# Patient Record
Sex: Female | Born: 1950
Health system: Southern US, Community
[De-identification: ages and names within clinical notes are randomized; demographics above are authoritative.]

## PROBLEM LIST (undated history)

## (undated) DIAGNOSIS — S2239XA Fracture of one rib, unspecified side, initial encounter for closed fracture: Secondary | ICD-10-CM

## (undated) DIAGNOSIS — B029 Zoster without complications: Secondary | ICD-10-CM

## (undated) DIAGNOSIS — L409 Psoriasis, unspecified: Secondary | ICD-10-CM

## (undated) DIAGNOSIS — D219 Benign neoplasm of connective and other soft tissue, unspecified: Secondary | ICD-10-CM

## (undated) DIAGNOSIS — A64 Unspecified sexually transmitted disease: Secondary | ICD-10-CM

## (undated) DIAGNOSIS — H269 Unspecified cataract: Secondary | ICD-10-CM

## (undated) DIAGNOSIS — C801 Malignant (primary) neoplasm, unspecified: Secondary | ICD-10-CM

## (undated) HISTORY — DX: Fracture of one rib, unspecified side, initial encounter for closed fracture: S22.39XA

## (undated) HISTORY — DX: Zoster without complications: B02.9

## (undated) HISTORY — PX: WISDOM TOOTH EXTRACTION: SHX21

## (undated) HISTORY — DX: Malignant (primary) neoplasm, unspecified: C80.1

## (undated) HISTORY — DX: Unspecified sexually transmitted disease: A64

## (undated) HISTORY — DX: Benign neoplasm of connective and other soft tissue, unspecified: D21.9

## (undated) HISTORY — DX: Psoriasis, unspecified: L40.9

## (undated) HISTORY — DX: Unspecified cataract: H26.9

## (undated) HISTORY — PX: VAGINAL HYSTERECTOMY: SUR661

---

## 2009-12-30 ENCOUNTER — Emergency Department (HOSPITAL_COMMUNITY): Admission: EM | Admit: 2009-12-30 | Discharge: 2009-12-30 | Payer: Self-pay | Admitting: Emergency Medicine

## 2010-04-23 ENCOUNTER — Emergency Department (HOSPITAL_COMMUNITY): Payer: Managed Care, Other (non HMO)

## 2010-04-23 ENCOUNTER — Emergency Department (HOSPITAL_COMMUNITY)
Admission: EM | Admit: 2010-04-23 | Discharge: 2010-04-23 | Disposition: A | Discharge status: Home / Self Care | Payer: Managed Care, Other (non HMO) | Attending: Emergency Medicine | Admitting: Emergency Medicine

## 2010-04-23 DIAGNOSIS — S1093XA Contusion of unspecified part of neck, initial encounter: Secondary | ICD-10-CM | POA: Insufficient documentation

## 2010-04-23 DIAGNOSIS — S2239XA Fracture of one rib, unspecified side, initial encounter for closed fracture: Secondary | ICD-10-CM | POA: Insufficient documentation

## 2010-04-23 DIAGNOSIS — T148XXA Other injury of unspecified body region, initial encounter: Secondary | ICD-10-CM | POA: Insufficient documentation

## 2010-04-23 DIAGNOSIS — Y92009 Unspecified place in unspecified non-institutional (private) residence as the place of occurrence of the external cause: Secondary | ICD-10-CM | POA: Insufficient documentation

## 2010-04-23 DIAGNOSIS — S0003XA Contusion of scalp, initial encounter: Secondary | ICD-10-CM | POA: Insufficient documentation

## 2010-04-23 DIAGNOSIS — IMO0002 Reserved for concepts with insufficient information to code with codable children: Secondary | ICD-10-CM | POA: Insufficient documentation

## 2010-04-23 DIAGNOSIS — W1789XA Other fall from one level to another, initial encounter: Secondary | ICD-10-CM | POA: Insufficient documentation

## 2010-11-20 ENCOUNTER — Other Ambulatory Visit: Payer: Self-pay | Admitting: Obstetrics & Gynecology

## 2010-11-20 DIAGNOSIS — N644 Mastodynia: Secondary | ICD-10-CM

## 2010-11-28 ENCOUNTER — Ambulatory Visit
Admission: RE | Admit: 2010-11-28 | Discharge: 2010-11-28 | Disposition: A | Discharge status: Home / Self Care | Payer: Managed Care, Other (non HMO) | Source: Ambulatory Visit | Attending: Obstetrics & Gynecology | Admitting: Obstetrics & Gynecology

## 2010-11-28 DIAGNOSIS — N644 Mastodynia: Secondary | ICD-10-CM

## 2011-10-21 ENCOUNTER — Other Ambulatory Visit: Payer: Self-pay | Admitting: Obstetrics and Gynecology

## 2011-10-21 ENCOUNTER — Other Ambulatory Visit: Payer: Self-pay | Admitting: Obstetrics & Gynecology

## 2011-10-21 DIAGNOSIS — Z1231 Encounter for screening mammogram for malignant neoplasm of breast: Secondary | ICD-10-CM

## 2011-12-01 ENCOUNTER — Ambulatory Visit
Admission: RE | Admit: 2011-12-01 | Discharge: 2011-12-01 | Disposition: A | Discharge status: Home / Self Care | Payer: Managed Care, Other (non HMO) | Source: Ambulatory Visit | Attending: Obstetrics and Gynecology | Admitting: Obstetrics and Gynecology

## 2011-12-01 DIAGNOSIS — Z1231 Encounter for screening mammogram for malignant neoplasm of breast: Secondary | ICD-10-CM

## 2012-11-19 ENCOUNTER — Encounter: Payer: Self-pay | Admitting: Certified Nurse Midwife

## 2012-11-22 ENCOUNTER — Other Ambulatory Visit: Payer: Self-pay

## 2012-11-22 ENCOUNTER — Encounter: Payer: Self-pay | Admitting: Certified Nurse Midwife

## 2012-11-22 ENCOUNTER — Ambulatory Visit (INDEPENDENT_AMBULATORY_CARE_PROVIDER_SITE_OTHER): Payer: Managed Care, Other (non HMO) | Admitting: Certified Nurse Midwife

## 2012-11-22 VITALS — BP 124/84 | HR 60 | Resp 16 | Ht 65.75 in | Wt 179.0 lb

## 2012-11-22 DIAGNOSIS — E559 Vitamin D deficiency, unspecified: Secondary | ICD-10-CM

## 2012-11-22 DIAGNOSIS — Z0000 Encounter for general adult medical examination without abnormal findings: Secondary | ICD-10-CM

## 2012-11-22 DIAGNOSIS — Z01419 Encounter for gynecological examination (general) (routine) without abnormal findings: Secondary | ICD-10-CM

## 2012-11-22 DIAGNOSIS — Z1231 Encounter for screening mammogram for malignant neoplasm of breast: Secondary | ICD-10-CM

## 2012-11-22 DIAGNOSIS — N952 Postmenopausal atrophic vaginitis: Secondary | ICD-10-CM

## 2012-11-22 DIAGNOSIS — Z23 Encounter for immunization: Secondary | ICD-10-CM

## 2012-11-22 LAB — POCT URINALYSIS DIPSTICK
Glucose, UA: NEGATIVE
Ketones, UA: NEGATIVE
Leukocytes, UA: NEGATIVE

## 2012-11-22 MED ORDER — ESTRADIOL 10 MCG VA TABS: 10.0000 ug | ORAL_TABLET | VAGINAL | Status: DC

## 2012-11-22 NOTE — Patient Instructions (Signed)

## 2012-11-22 NOTE — Progress Notes (Signed)
62 y.o. G90P2002 Married Caucasian Fe here for annual exam.  Menopausal no HRT. Denies vaginal bleeding. Using Vagifem for vaginal dryness with good results. Feels she has not had in last 7 years. No change in rectocele, no bowel issues. Sees PCP prn.  Patient's last menstrual period was 03/11/1995.          Sexually active: yes  The current method of family planning is status post hysterectomy.    Exercising: yes  walking Smoker:  no  Health Maintenance: Pap:  2010  MMG:  12/01/11 normal Colonoscopy:  2009 BMD:   2008? TDaP:  Unsure per patient greater than 7 years. Labs: Poct urine-neg, Hgb-13.9 Self breast exam: done occ   reports that she has never smoked. She does not have any smokeless tobacco history on file. She reports that she does not drink alcohol or use illicit drugs.  Past Medical History  Diagnosis Date  . Psoriasis   . Shingles times 3  . STD (sexually transmitted disease)     HSV1  . Fibroid   . Rib fracture     Past Surgical History  Procedure Laterality Date  . Vaginal hysterectomy      ovaries retained  . Wisdom tooth extraction      Current Outpatient Prescriptions  Medication Sig Dispense Refill  . CALCIUM PO Take by mouth daily.      . Estradiol (VAGIFEM) 10 MCG TABS vaginal tablet Place vaginally 2 (two) times a week.      Marland Kitchen GLUCOSAMINE-CHONDROITIN PO Take by mouth daily.      . Multiple Vitamins-Minerals (ICAPS) CAPS Take by mouth daily.      . Multiple Vitamins-Minerals (MULTIVITAMIN PO) Take by mouth every other day.        No current facility-administered medications for this visit.    Family History  Problem Relation Age of Onset  . Cancer Mother     uterine & colon  . Diabetes Mother   . Macular degeneration Mother   . Hepatitis Father   . Deep vein thrombosis Sister   . Diabetes Paternal Grandmother   . Stroke Maternal Grandmother   . Angina Maternal Grandfather   . Heart disease Paternal Grandfather     pacemaker    ROS:   Pertinent items are noted in HPI.  Otherwise, a comprehensive ROS was negative.  Exam:   BP 124/84  Pulse 60  Resp 16  Ht 5' 5.75" (1.67 m)  Wt 179 lb (81.194 kg)  BMI 29.11 kg/m2  LMP 03/11/1995 Height: 5' 5.75" (167 cm)  Ht Readings from Last 3 Encounters:  11/22/12 5' 5.75" (1.67 m)    General appearance: alert, cooperative and appears stated age Head: Normocephalic, without obvious abnormality, atraumatic Neck: no adenopathy, supple, symmetrical, trachea midline and thyroid normal to inspection and palpation Lungs: clear to auscultation bilaterally Breasts: normal appearance, no masses or tenderness, No nipple retraction or dimpling, No nipple discharge or bleeding, No axillary or supraclavicular adenopathy Heart: regular rate and rhythm Abdomen: soft, non-tender; no masses,  no organomegaly Extremities: extremities normal, atraumatic, no cyanosis or edema Skin: Skin color, texture, turgor normal. No rashes or lesions Lymph nodes: Cervical, supraclavicular, and axillary nodes normal. No abnormal inguinal nodes palpated Neurologic: Grossly normal   Pelvic: External genitalia:  no lesions              Urethra:  normal appearing urethra with no masses, tenderness or lesions  Bartholin's and Skene's: normal                 Vagina: normal appearing vagina with normal color and discharge, no lesions, rectocele grade 3              Cervix: absent              Pap taken: no Bimanual Exam:  Uterus:  uterus absent              Adnexa: normal adnexa and no mass, fullness, tenderness               Rectovaginal: Confirms               Anus:  normal sphincter tone, no lesions  A:  Well Woman with normal exam  Menopausal no HRT, S/P TVH due to bleeding  History of vitamin D deficiency  Immunization update   Atrophic vaginitis Vagifem working well  P:   Reviewed health and wellness pertinent to exam  Lab: Vitamin D  Reviewed risks and benefits, patient requests  TDAP  Rx Vagifem see order  Pap smear as per guidelines   Mammogram yearly pap smear not taken today counseled on breast self exam, mammography screening, adequate intake of calcium and vitamin D, diet and exercise, Kegel's exercises. Discussed risks and benefits of colonoscopy, patient to schedule. If patient has problems with scheduling will advise.  return annually or prn  An After Visit Summary was printed and given to the patient.

## 2012-11-23 LAB — VITAMIN D 25 HYDROXY (VIT D DEFICIENCY, FRACTURES): Vit D, 25-Hydroxy: 31 ng/mL (ref 30–89)

## 2012-11-23 LAB — HEMOGLOBIN, FINGERSTICK: Hemoglobin, fingerstick: 13.9 g/dL (ref 12.0–16.0)

## 2012-11-23 NOTE — Progress Notes (Signed)
Note reviewed, agree with plan.  Kaja Jackowski, MD  

## 2012-12-10 ENCOUNTER — Ambulatory Visit
Admission: RE | Admit: 2012-12-10 | Discharge: 2012-12-10 | Disposition: A | Discharge status: Home / Self Care | Payer: Managed Care, Other (non HMO) | Source: Ambulatory Visit

## 2012-12-10 DIAGNOSIS — Z1231 Encounter for screening mammogram for malignant neoplasm of breast: Secondary | ICD-10-CM

## 2013-11-17 ENCOUNTER — Other Ambulatory Visit: Payer: Self-pay

## 2013-11-17 DIAGNOSIS — N952 Postmenopausal atrophic vaginitis: Secondary | ICD-10-CM

## 2013-11-17 NOTE — Telephone Encounter (Signed)
Last AEX; 11/22/12 Last refill:11/22/12 #24 X 3 Current AEX:11/23/13  Please advise

## 2013-11-23 ENCOUNTER — Ambulatory Visit (INDEPENDENT_AMBULATORY_CARE_PROVIDER_SITE_OTHER): Payer: Managed Care, Other (non HMO) | Admitting: Certified Nurse Midwife

## 2013-11-23 ENCOUNTER — Other Ambulatory Visit: Payer: Self-pay

## 2013-11-23 ENCOUNTER — Encounter: Payer: Self-pay | Admitting: Certified Nurse Midwife

## 2013-11-23 VITALS — BP 110/78 | HR 68 | Resp 16 | Ht 65.75 in | Wt 168.0 lb

## 2013-11-23 DIAGNOSIS — T63444D Toxic effect of venom of bees, undetermined, subsequent encounter: Secondary | ICD-10-CM

## 2013-11-23 DIAGNOSIS — Z01419 Encounter for gynecological examination (general) (routine) without abnormal findings: Secondary | ICD-10-CM

## 2013-11-23 DIAGNOSIS — Z0000 Encounter for general adult medical examination without abnormal findings: Secondary | ICD-10-CM

## 2013-11-23 DIAGNOSIS — Z1231 Encounter for screening mammogram for malignant neoplasm of breast: Secondary | ICD-10-CM

## 2013-11-23 DIAGNOSIS — Z5189 Encounter for other specified aftercare: Secondary | ICD-10-CM

## 2013-11-23 DIAGNOSIS — N952 Postmenopausal atrophic vaginitis: Secondary | ICD-10-CM

## 2013-11-23 LAB — POCT URINALYSIS DIPSTICK
BILIRUBIN UA: NEGATIVE
Blood, UA: NEGATIVE
Glucose, UA: NEGATIVE
KETONES UA: NEGATIVE
LEUKOCYTES UA: NEGATIVE
Nitrite, UA: NEGATIVE
PH UA: 5
Protein, UA: NEGATIVE
Urobilinogen, UA: NEGATIVE

## 2013-11-23 LAB — HEMOGLOBIN, FINGERSTICK: Hemoglobin, fingerstick: 13.7 g/dL (ref 12.0–16.0)

## 2013-11-23 MED ORDER — EPINEPHRINE 0.3 MG/0.3ML IJ SOAJ: 0.3000 mg | Freq: Once | INTRAMUSCULAR | Status: DC

## 2013-11-23 MED ORDER — ESTRADIOL 10 MCG VA TABS: ORAL_TABLET | VAGINAL | Status: DC

## 2013-11-23 MED ORDER — ESTRADIOL 10 MCG VA TABS: 10.0000 ug | ORAL_TABLET | VAGINAL | Status: DC

## 2013-11-23 NOTE — Patient Instructions (Signed)

## 2013-11-23 NOTE — Progress Notes (Signed)
63 y.o. G75P2002 Married Caucasian Fe here for annual exam.  Menopausal no HRT. Denies vaginal bleeding. Using Vagifem with good results, for dryness.  Planning trip to [place] soon and needs one Rx for Vagifem because mail order will not be in. Also Epi pen expired Bee allergy and needs refill. Sees PCP for aex and labs. No health issues today.  Patient's last menstrual period was 03/11/1995.          Sexually active: Yes.    The current method of family planning is status post hysterectomy.    Exercising: Yes.    walking Smoker:  no  Health Maintenance: Pap:  2010 neg per patient MMG:  12-10-12 density category b, birads category 1:neg Colonoscopy:  06-03-13 neg per patient 5 years BMD:   2007 TDaP: 2014 Labs: Poct urine-neg, Hgb-13.7 Self breast exam: done occ   reports that she has never smoked. She does not have any smokeless tobacco history on file. She reports that she does not drink alcohol or use illicit drugs.  Past Medical History  Diagnosis Date  . Psoriasis   . Shingles times 3  . STD (sexually transmitted disease)     HSV1  . Fibroid   . Rib fracture     Past Surgical History  Procedure Laterality Date  . Vaginal hysterectomy      ovaries retained  . Wisdom tooth extraction      Current Outpatient Prescriptions  Medication Sig Dispense Refill  . CALCIUM PO Take by mouth daily.      . Estradiol (VAGIFEM) 10 MCG TABS vaginal tablet Place 1 tablet (10 mcg total) vaginally 2 (two) times a week.  24 tablet  3  . GLUCOSAMINE-CHONDROITIN PO Take by mouth daily.      . Multiple Vitamins-Minerals (ICAPS) CAPS Take by mouth daily.      . Multiple Vitamins-Minerals (MULTIVITAMIN PO) Take by mouth every other day.       Marland Kitchen EPINEPHrine 0.3 mg/0.3 mL IJ SOAJ injection Inject 0.3 mLs (0.3 mg total) into the muscle once.  1 Device  1  . Estradiol (VAGIFEM) 10 MCG TABS vaginal tablet Use as directed  8 tablet  0   No current facility-administered medications for this visit.     Family History  Problem Relation Age of Onset  . Cancer Mother     uterine & colon  . Diabetes Mother   . Macular degeneration Mother   . Hepatitis Father   . Deep vein thrombosis Sister   . Diabetes Paternal Grandmother   . Stroke Maternal Grandmother   . Angina Maternal Grandfather   . Heart disease Paternal Grandfather     pacemaker    ROS:  Pertinent items are noted in HPI.  Otherwise, a comprehensive ROS was negative.  Exam:   BP 110/78  Pulse 68  Resp 16  Ht 5' 5.75" (1.67 m)  Wt 168 lb (76.204 kg)  BMI 27.32 kg/m2  LMP 03/11/1995 Height: 5' 5.75" (167 cm)  Ht Readings from Last 3 Encounters:  11/23/13 5' 5.75" (1.67 m)  11/22/12 5' 5.75" (1.67 m)    General appearance: alert, cooperative and appears stated age Head: Normocephalic, without obvious abnormality, atraumatic Neck: no adenopathy, supple, symmetrical, trachea midline and thyroid normal to inspection and palpation Lungs: clear to auscultation bilaterally Breasts: normal appearance, no masses or tenderness, No nipple retraction or dimpling, No nipple discharge or bleeding, No axillary or supraclavicular adenopathy Heart: regular rate and rhythm Abdomen: soft, non-tender; no masses,  no organomegaly Extremities: extremities normal, atraumatic, no cyanosis or edema Skin: Skin color, texture, turgor normal. No rashes or lesions Lymph nodes: Cervical, supraclavicular, and axillary nodes normal. No abnormal inguinal nodes palpated Neurologic: Grossly normal   Pelvic: External genitalia:  no lesions              Urethra:  normal appearing urethra with no masses, tenderness or lesions              Bartholin's and Skene's: normal                 Vagina: normal appearing vagina with normal color and discharge, no lesions              Cervix: absent              Pap taken: No. Bimanual Exam:  Uterus:  uterus absent              Adnexa: no mass, fullness, tenderness and adnexal absent                Rectovaginal: Confirms               Anus:  normal sphincter tone, no lesions  A:  Well Woman with normal exam  Menopausal no HRT  Atrophic vaginitis Using Vagifem with good results  Shingles candidate  Bee allergy  P:   Reviewed health and wellness pertinent to exam  Rx Vagifem see order to local pharmacy one month  Refills to mail order  Rx Epi pen see order  Pap smear not taken today   counseled on breast self exam, mammography screening, adequate intake of calcium and vitamin D, diet and exercise  return annually or prn  An After Visit Summary was printed and given to the patient.

## 2013-11-28 NOTE — Progress Notes (Signed)
Reviewed personally.  M. Suzanne Truth Barot, MD.  

## 2013-12-12 ENCOUNTER — Telehealth: Payer: Self-pay | Admitting: Certified Nurse Midwife

## 2013-12-12 DIAGNOSIS — N952 Postmenopausal atrophic vaginitis: Secondary | ICD-10-CM

## 2013-12-12 MED ORDER — ESTRADIOL 10 MCG VA TABS: 10.0000 ug | ORAL_TABLET | VAGINAL | Status: DC

## 2013-12-12 NOTE — Telephone Encounter (Signed)
Estradiol (VAGIFEM) 10 MCG TABS vaginal tablet Patient needs refills of Vagifem called to [facility]

## 2013-12-12 NOTE — Telephone Encounter (Signed)
Vagifem -Last refilled: 11/23/13 #24/3 refills was sent to [facility] -Phamacy: 11/23/13  -AEX Scheduled: 11/29/14 with Ms. Debbie  -Vagifem #24/3 refills sent to [facility]  -Patient is aware.  Routed to provider for review, encounter closed.

## 2013-12-15 ENCOUNTER — Ambulatory Visit
Admission: RE | Admit: 2013-12-15 | Discharge: 2013-12-15 | Disposition: A | Discharge status: Home / Self Care | Payer: Managed Care, Other (non HMO) | Source: Ambulatory Visit

## 2013-12-15 DIAGNOSIS — Z1231 Encounter for screening mammogram for malignant neoplasm of breast: Secondary | ICD-10-CM

## 2013-12-19 ENCOUNTER — Telehealth: Payer: Self-pay | Admitting: Certified Nurse Midwife

## 2013-12-19 NOTE — Telephone Encounter (Signed)
Pt is calling to Follow up from a conversation with debbi about getting rx for shingles vaccine.

## 2013-12-20 NOTE — Telephone Encounter (Signed)
Patient has decided she would like shingles vaccine.  Would like rx.  Advised would call her back when we have it at front desk for her to pick up.  Patient requests detailed message to home number when ready.

## 2013-12-21 NOTE — Telephone Encounter (Signed)
Spoke with patient. Advised rx for shingles vaccine is up at the front desk ready to pick up. Patient is agreeable and will come by to pick up today.  Routing to provider for final review. Patient agreeable to disposition. Will close encounter

## 2014-01-09 ENCOUNTER — Encounter: Payer: Self-pay | Admitting: Certified Nurse Midwife

## 2014-11-07 ENCOUNTER — Other Ambulatory Visit: Payer: Self-pay | Admitting: Certified Nurse Midwife

## 2014-11-07 NOTE — Telephone Encounter (Signed)
Medication refill request: Vagifem  10 mcg  Last AEX:  11/23/13 with DL Next AEX: 11/29/14 with DL  Last MMG (if hormonal medication request): 12/15/2013 breast density category bi-rads 1: neg Refill authorized: #[id number] rfs

## 2014-11-29 ENCOUNTER — Other Ambulatory Visit: Payer: Self-pay

## 2014-11-29 ENCOUNTER — Ambulatory Visit (INDEPENDENT_AMBULATORY_CARE_PROVIDER_SITE_OTHER): Payer: Managed Care, Other (non HMO) | Admitting: Certified Nurse Midwife

## 2014-11-29 ENCOUNTER — Encounter: Payer: Self-pay | Admitting: Certified Nurse Midwife

## 2014-11-29 VITALS — BP 122/80 | HR 72 | Resp 16 | Ht 65.75 in | Wt 182.0 lb

## 2014-11-29 DIAGNOSIS — Z01419 Encounter for gynecological examination (general) (routine) without abnormal findings: Secondary | ICD-10-CM

## 2014-11-29 DIAGNOSIS — Z0000 Encounter for general adult medical examination without abnormal findings: Secondary | ICD-10-CM | POA: Diagnosis not present

## 2014-11-29 DIAGNOSIS — N8110 Cystocele, unspecified: Secondary | ICD-10-CM | POA: Diagnosis not present

## 2014-11-29 DIAGNOSIS — N816 Rectocele: Secondary | ICD-10-CM

## 2014-11-29 DIAGNOSIS — N952 Postmenopausal atrophic vaginitis: Secondary | ICD-10-CM

## 2014-11-29 DIAGNOSIS — Z1231 Encounter for screening mammogram for malignant neoplasm of breast: Secondary | ICD-10-CM

## 2014-11-29 LAB — POCT URINALYSIS DIPSTICK
Bilirubin, UA: NEGATIVE
Blood, UA: NEGATIVE
Glucose, UA: NEGATIVE
KETONES UA: NEGATIVE
LEUKOCYTES UA: NEGATIVE
NITRITE UA: NEGATIVE
PH UA: 5
PROTEIN UA: NEGATIVE
Urobilinogen, UA: NEGATIVE

## 2014-11-29 MED ORDER — ESTRADIOL 10 MCG VA TABS: ORAL_TABLET | VAGINAL | Status: DC

## 2014-11-29 NOTE — Progress Notes (Signed)
Reviewed personally.  M. Suzanne Miller, MD.  

## 2014-11-29 NOTE — Patient Instructions (Signed)

## 2014-11-29 NOTE — Progress Notes (Signed)
64 y.o. G48P2002 Married  Caucasian Fe here for annual exam.  Menopausal no HRT. Denies vaginal bleeding. Using Vagifem for atrophic vaginitis, working well. Needs refill. Patient would see urgent care if needed. New grandchildren 3 new granddaughters! Needs screening labs, but not sure she will do today. No health issues today.  Patient's last menstrual period was 03/11/1995.          Sexually active: Yes.    The current method of family planning is status post hysterectomy.    Exercising: Yes.    walking Smoker:  no  Health Maintenance: Pap: 2010 neg per patient MMG: 12-15-13 category b density,birads 1:neg Colonoscopy:  2015 neg f/u 11yrs per patient family history BMD:   2007 TDaP:  2014 Labs: poct urine-neg Self breast exam: done occ   reports that she has never smoked. She does not have any smokeless tobacco history on file. She reports that she does not drink alcohol or use illicit drugs.  Past Medical History  Diagnosis Date  . Psoriasis   . Shingles times 3  . STD (sexually transmitted disease)     HSV1  . Fibroid   . Rib fracture     Past Surgical History  Procedure Laterality Date  . Vaginal hysterectomy      ovaries retained  . Wisdom tooth extraction      Current Outpatient Prescriptions  Medication Sig Dispense Refill  . CALCIUM PO Take by mouth daily.    . Multiple Vitamins-Minerals (ICAPS) CAPS Take by mouth daily.    . Multiple Vitamins-Minerals (MULTIVITAMIN PO) Take by mouth every other day.     Marland Kitchen VAGIFEM 10 MCG TABS vaginal tablet INSERT 1 TABLET VAGINALLY 2TIMES A WEEK 24 tablet 0  . EPINEPHrine 0.3 mg/0.3 mL IJ SOAJ injection Inject 0.3 mLs (0.3 mg total) into the muscle once. (Patient not taking: Reported on 11/29/2014) 1 Device 1   No current facility-administered medications for this visit.    Family History  Problem Relation Age of Onset  . Cancer Mother     uterine & colon  . Diabetes Mother   . Macular degeneration Mother   . Hepatitis  Father   . Deep vein thrombosis Sister   . Diabetes Paternal Grandmother   . Stroke Maternal Grandmother   . Angina Maternal Grandfather   . Heart disease Paternal Grandfather     pacemaker    ROS:  Pertinent items are noted in HPI.  Otherwise, a comprehensive ROS was negative.  Exam:   BP 122/80 mmHg  Pulse 72  Resp 16  Ht 5' 5.75" (1.67 m)  Wt 182 lb (82.555 kg)  BMI 29.60 kg/m2  LMP 03/11/1995 Height: 5' 5.75" (167 cm) Ht Readings from Last 3 Encounters:  11/29/14 5' 5.75" (1.67 m)  11/23/13 5' 5.75" (1.67 m)  11/22/12 5' 5.75" (1.67 m)    General appearance: alert, cooperative and appears stated age Head: Normocephalic, without obvious abnormality, atraumatic Neck: no adenopathy, supple, symmetrical, trachea midline and thyroid normal to inspection and palpation Lungs: clear to auscultation bilaterally Breasts: normal appearance, no masses or tenderness, No nipple retraction or dimpling, No nipple discharge or bleeding, No axillary or supraclavicular adenopathy Heart: regular rate and rhythm Abdomen: soft, non-tender; no masses,  no organomegaly Extremities: extremities normal, atraumatic, no cyanosis or edema Skin: Skin color, texture, turgor normal. No rashes or lesions Lymph nodes: Cervical, supraclavicular, and axillary nodes normal. No abnormal inguinal nodes palpated Neurologic: Grossly normal   Pelvic: External genitalia:  no lesions  Urethra:  normal appearing urethra with no masses, tenderness or lesions              Bartholin's and Skene's: normal                 Vagina: normal appearing vagina with normal color and discharge, no lesions, cystocele and rectocele mild noted              Cervix: absent              Pap taken: No. Bimanual Exam:  Uterus:  uterus absent              Adnexa: normal adnexa and no mass, fullness, tenderness               Rectovaginal: Confirms               Anus:  normal sphincter tone, no lesions  Chaperone  present: yes  A:  Well Woman with normal exam  Menopausal no HRT S/P TAH for fibroids, ovaries retained  Atrophic vaginitis with Vagifem use  Schedule fasting labs  BMD/mammogram due   Mild cystocele and rectocele noted  P:   Reviewed health and wellness pertinent to exam  Rx Vagifem see order  Labs: CMP,Lipid panel, TSH, CBC, Vitamin D  Patient will schedule mammogram and BMD  Discussed findings and etiology. Discussed working on kegel exercises to improve support. Questions answered. Will advise if changes.  Pap smear as above not taken   counseled on breast self exam, mammography screening, adequate intake of calcium and vitamin D, diet and exercise, Kegel's exercises  return annually or prn  An After Visit Summary was printed and given to the patient.

## 2014-12-04 ENCOUNTER — Other Ambulatory Visit (INDEPENDENT_AMBULATORY_CARE_PROVIDER_SITE_OTHER): Payer: Managed Care, Other (non HMO)

## 2014-12-04 DIAGNOSIS — Z780 Asymptomatic menopausal state: Secondary | ICD-10-CM

## 2014-12-04 DIAGNOSIS — Z0000 Encounter for general adult medical examination without abnormal findings: Secondary | ICD-10-CM

## 2014-12-04 LAB — LIPID PANEL
CHOLESTEROL: 189 mg/dL (ref 125–200)
HDL: 47 mg/dL (ref >=46)
LDL Cholesterol: 111 mg/dL (ref <130)
TRIGLYCERIDES: 153 mg/dL — AB (ref <150)
Total CHOL/HDL Ratio: 4 Ratio (ref <=5.0)
VLDL: 31 mg/dL — AB (ref <30)

## 2014-12-04 LAB — COMPREHENSIVE METABOLIC PANEL
ALBUMIN: 4.1 g/dL (ref 3.6–5.1)
ALK PHOS: 47 U/L (ref 33–130)
ALT: 9 U/L (ref 6–29)
AST: 14 U/L (ref 10–35)
BUN: 11 mg/dL (ref 7–25)
CHLORIDE: 105 mmol/L (ref 98–110)
CO2: 30 mmol/L (ref 20–31)
CREATININE: 0.79 mg/dL (ref 0.50–0.99)
Calcium: 9.1 mg/dL (ref 8.6–10.4)
Glucose, Bld: 86 mg/dL (ref 65–99)
Potassium: 4 mmol/L (ref 3.5–5.3)
SODIUM: 139 mmol/L (ref 135–146)
TOTAL PROTEIN: 6.4 g/dL (ref 6.1–8.1)
Total Bilirubin: 0.7 mg/dL (ref 0.2–1.2)

## 2014-12-04 LAB — CBC
HEMATOCRIT: 41.4 % (ref 36.0–46.0)
HEMOGLOBIN: 14.3 g/dL (ref 12.0–15.0)
MCH: 31.7 pg (ref 26.0–34.0)
MCHC: 34.5 g/dL (ref 30.0–36.0)
MCV: 91.8 fL (ref 78.0–100.0)
MPV: 10.1 fL (ref 8.6–12.4)
Platelets: 253 10*3/uL (ref 150–400)
RBC: 4.51 MIL/uL (ref 3.87–5.11)
RDW: 13.3 % (ref 11.5–15.5)
WBC: 3.6 10*3/uL — AB (ref 4.0–10.5)

## 2014-12-04 LAB — VITAMIN D 25 HYDROXY (VIT D DEFICIENCY, FRACTURES): Vit D, 25-Hydroxy: 37 ng/mL (ref 30–100)

## 2014-12-04 LAB — TSH: TSH: 2.421 u[IU]/mL (ref 0.350–4.500)

## 2014-12-06 ENCOUNTER — Telehealth: Payer: Self-pay

## 2014-12-06 ENCOUNTER — Other Ambulatory Visit: Payer: Self-pay | Admitting: Certified Nurse Midwife

## 2014-12-06 DIAGNOSIS — R899 Unspecified abnormal finding in specimens from other organs, systems and tissues: Secondary | ICD-10-CM

## 2014-12-06 NOTE — Telephone Encounter (Signed)
-----   Message from Regina Eck, CNM sent at 12/06/2014 12:20 PM EDT ----- Notify patient that Vitamin D is 37 which is low normal. Would recommend  OTC 800 IU Vitamin D 3 daily to maintain TSH and liver, kidney and glucose profile are normal Lipid panel shows slightly elevated triglycerides so watch the fried and sweet foods in diet Over all profile looks good CBC normal, but WBC slightly low needs recheck on one month order in please schedule

## 2014-12-06 NOTE — Telephone Encounter (Signed)
lmtcb

## 2014-12-06 NOTE — Telephone Encounter (Signed)
Pt notified by reina,cma

## 2015-01-04 ENCOUNTER — Other Ambulatory Visit (INDEPENDENT_AMBULATORY_CARE_PROVIDER_SITE_OTHER): Payer: Managed Care, Other (non HMO)

## 2015-01-04 DIAGNOSIS — R899 Unspecified abnormal finding in specimens from other organs, systems and tissues: Secondary | ICD-10-CM

## 2015-01-04 LAB — CBC WITH DIFFERENTIAL/PLATELET
BASOS PCT: 0 % (ref 0–1)
Basophils Absolute: 0 10*3/uL (ref 0.0–0.1)
EOS ABS: 0 10*3/uL (ref 0.0–0.7)
Eosinophils Relative: 1 % (ref 0–5)
HCT: 41.2 % (ref 36.0–46.0)
Hemoglobin: 13.7 g/dL (ref 12.0–15.0)
LYMPHS ABS: 1.1 10*3/uL (ref 0.7–4.0)
Lymphocytes Relative: 26 % (ref 12–46)
MCH: 31 pg (ref 26.0–34.0)
MCHC: 33.3 g/dL (ref 30.0–36.0)
MCV: 93.2 fL (ref 78.0–100.0)
MONO ABS: 0.3 10*3/uL (ref 0.1–1.0)
MONOS PCT: 8 % (ref 3–12)
MPV: 10.4 fL (ref 8.6–12.4)
Neutro Abs: 2.8 10*3/uL (ref 1.7–7.7)
Neutrophils Relative %: 65 % (ref 43–77)
PLATELETS: 277 10*3/uL (ref 150–400)
RBC: 4.42 MIL/uL (ref 3.87–5.11)
RDW: 13.2 % (ref 11.5–15.5)
WBC: 4.3 10*3/uL (ref 4.0–10.5)

## 2015-01-12 ENCOUNTER — Other Ambulatory Visit: Payer: Managed Care, Other (non HMO)

## 2015-01-12 ENCOUNTER — Ambulatory Visit: Payer: Managed Care, Other (non HMO)

## 2015-02-07 ENCOUNTER — Ambulatory Visit
Admission: RE | Admit: 2015-02-07 | Discharge: 2015-02-07 | Disposition: A | Discharge status: Home / Self Care | Payer: Managed Care, Other (non HMO) | Source: Ambulatory Visit | Attending: Certified Nurse Midwife | Admitting: Certified Nurse Midwife

## 2015-02-07 ENCOUNTER — Ambulatory Visit
Admission: RE | Admit: 2015-02-07 | Discharge: 2015-02-07 | Disposition: A | Discharge status: Home / Self Care | Payer: Managed Care, Other (non HMO) | Source: Ambulatory Visit

## 2015-02-07 DIAGNOSIS — Z1231 Encounter for screening mammogram for malignant neoplasm of breast: Secondary | ICD-10-CM

## 2015-02-07 DIAGNOSIS — Z780 Asymptomatic menopausal state: Secondary | ICD-10-CM

## 2015-11-30 ENCOUNTER — Ambulatory Visit: Payer: Managed Care, Other (non HMO) | Admitting: Certified Nurse Midwife

## 2015-12-04 ENCOUNTER — Ambulatory Visit (INDEPENDENT_AMBULATORY_CARE_PROVIDER_SITE_OTHER): Payer: Managed Care, Other (non HMO) | Admitting: Certified Nurse Midwife

## 2015-12-04 ENCOUNTER — Encounter: Payer: Self-pay | Admitting: Certified Nurse Midwife

## 2015-12-04 VITALS — BP 118/78 | HR 64 | Resp 16 | Ht 66.0 in | Wt 167.0 lb

## 2015-12-04 DIAGNOSIS — N8110 Cystocele, unspecified: Secondary | ICD-10-CM

## 2015-12-04 DIAGNOSIS — Z01419 Encounter for gynecological examination (general) (routine) without abnormal findings: Secondary | ICD-10-CM | POA: Diagnosis not present

## 2015-12-04 DIAGNOSIS — N952 Postmenopausal atrophic vaginitis: Secondary | ICD-10-CM

## 2015-12-04 DIAGNOSIS — Z0000 Encounter for general adult medical examination without abnormal findings: Secondary | ICD-10-CM

## 2015-12-04 DIAGNOSIS — N816 Rectocele: Secondary | ICD-10-CM

## 2015-12-04 LAB — CBC WITH DIFFERENTIAL/PLATELET
BASOS ABS: 0 {cells}/uL (ref 0–200)
Basophils Relative: 0 %
EOS PCT: 1 %
Eosinophils Absolute: 42 cells/uL (ref 15–500)
HCT: 41.9 % (ref 35.0–45.0)
HEMOGLOBIN: 14 g/dL (ref 11.7–15.5)
LYMPHS ABS: 1302 {cells}/uL (ref 850–3900)
LYMPHS PCT: 31 %
MCH: 31.3 pg (ref 27.0–33.0)
MCHC: 33.4 g/dL (ref 32.0–36.0)
MCV: 93.5 fL (ref 80.0–100.0)
MPV: 10.3 fL (ref 7.5–12.5)
Monocytes Absolute: 378 cells/uL (ref 200–950)
Monocytes Relative: 9 %
NEUTROS PCT: 59 %
Neutro Abs: 2478 cells/uL (ref 1500–7800)
Platelets: 249 10*3/uL (ref 140–400)
RBC: 4.48 MIL/uL (ref 3.80–5.10)
RDW: 12.9 % (ref 11.0–15.0)
WBC: 4.2 10*3/uL (ref 3.8–10.8)

## 2015-12-04 LAB — LIPID PANEL
CHOL/HDL RATIO: 3.7 ratio (ref <=5.0)
CHOLESTEROL: 196 mg/dL (ref 125–200)
HDL: 53 mg/dL (ref >=46)
LDL Cholesterol: 123 mg/dL (ref <130)
Triglycerides: 98 mg/dL (ref <150)
VLDL: 20 mg/dL (ref <30)

## 2015-12-04 LAB — POCT URINALYSIS DIPSTICK
BILIRUBIN UA: NEGATIVE
Blood, UA: NEGATIVE
Glucose, UA: NEGATIVE
KETONES UA: NEGATIVE
LEUKOCYTES UA: NEGATIVE
Nitrite, UA: NEGATIVE
PH UA: 5
Protein, UA: NEGATIVE
Urobilinogen, UA: NEGATIVE

## 2015-12-04 MED ORDER — ESTRADIOL 10 MCG VA TABS: ORAL_TABLET | VAGINAL | 4 refills | Status: DC

## 2015-12-04 NOTE — Patient Instructions (Signed)

## 2015-12-04 NOTE — Progress Notes (Signed)
65 y.o. G13P2002 Married  Caucasian Fe here for annual exam. Menopausal no vaginal bleeding or vaginal dryness. Using Vagifem with good results, desires continuance. Planning on establishing with PCP in the next year, request recommendations. No health issues today. Screening labs desired. Spent summer with all 3 granddaughters in 3 different states!  Patient's last menstrual period was 03/11/1995.          Sexually active: Yes.    The current method of family planning is status post hysterectomy.    Exercising: Yes.    walking Smoker:  no  Health Maintenance: Pap:  2010 neg per patient MMG:  02-07-15 category b density birads 1:neg Colonoscopy:  2015 neg f/u 43yrs due to family hx BMD:   2016 TDaP:  2014 Shingles: 2015 Pneumonia: not done Hep C and HIV: not done Labs: poct urine-neg Self breast exam: done occ Flu shot 11-23-15   reports that she has never smoked. She has never used smokeless tobacco. She reports that she does not drink alcohol or use drugs.  Past Medical History:  Diagnosis Date  . Fibroid   . Psoriasis   . Rib fracture   . Shingles times 3  . STD (sexually transmitted disease)    HSV1    Past Surgical History:  Procedure Laterality Date  . VAGINAL HYSTERECTOMY     ovaries retained  . WISDOM TOOTH EXTRACTION      Current Outpatient Prescriptions  Medication Sig Dispense Refill  . CALCIUM PO Take by mouth daily.    . Cholecalciferol (VITAMIN D PO) Take by mouth daily.    . Estradiol (VAGIFEM) 10 MCG TABS vaginal tablet INSERT 1 TABLET VAGINALLY 2TIMES A WEEK 24 tablet 4  . Multiple Vitamins-Minerals (ICAPS) CAPS Take by mouth daily.    Marland Kitchen EPINEPHrine 0.3 mg/0.3 mL IJ SOAJ injection Inject 0.3 mLs (0.3 mg total) into the muscle once. (Patient not taking: Reported on 12/04/2015) 1 Device 1   No current facility-administered medications for this visit.     Family History  Problem Relation Age of Onset  . Cancer Mother     uterine & colon  . Diabetes  Mother   . Macular degeneration Mother   . Hepatitis Father   . Deep vein thrombosis Sister   . Diabetes Paternal Grandmother   . Stroke Maternal Grandmother   . Angina Maternal Grandfather   . Heart disease Paternal Grandfather     pacemaker    ROS:  Pertinent items are noted in HPI.  Otherwise, a comprehensive ROS was negative.  Exam:   BP 118/78   Pulse 64   Resp 16   Ht 5\' 6"  (1.676 m)   Wt 167 lb (75.8 kg)   LMP 03/11/1995   BMI 26.95 kg/m  Height: 5\' 6"  (167.6 cm) Ht Readings from Last 3 Encounters:  12/04/15 5\' 6"  (1.676 m)  11/29/14 5' 5.75" (1.67 m)  11/23/13 5' 5.75" (1.67 m)    General appearance: alert, cooperative and appears stated age Head: Normocephalic, without obvious abnormality, atraumatic Neck: no adenopathy, supple, symmetrical, trachea midline and thyroid normal to inspection and palpation Lungs: clear to auscultation bilaterally Breasts: normal appearance, no masses or tenderness, No nipple retraction or dimpling, No nipple discharge or bleeding, No axillary or supraclavicular adenopathy Heart: regular rate and rhythm Abdomen: soft, non-tender; no masses,  no organomegaly Extremities: extremities normal, atraumatic, no cyanosis or edema Skin: Skin color, texture, turgor normal. No rashes or lesions Lymph nodes: Cervical, supraclavicular, and axillary nodes normal. No  abnormal inguinal nodes palpated Neurologic: Grossly normal   Pelvic: External genitalia:  no lesions              Urethra:  normal appearing urethra with no masses, tenderness or lesions              Bartholin's and Skene's: normal                 Vagina: normal appearing vagina with normal color and discharge, no lesions              Cervix: absent              Pap taken: No. Bimanual Exam:  Uterus:  uterus absent              Adnexa: normal adnexa and no mass, fullness, tenderness               Rectovaginal: Confirms               Anus:  normal sphincter tone, no  lesions  Chaperone present: yes  A:  Well Woman with normal exam  Menopausal  No HRT  Atrophic vaginitis with Vagifem use with good results, desires continuance  Cystocele/rectocele mild no change, not symptomatic  Screening labs  P:   Reviewed health and wellness pertinent to exam  Discussed risks and benefits of Vagifem use, questions addressed.  Continue with kegel exercises and avoid holding urine for long periods of time. Will advise if change.  Rx Vagifem see order with instructions  Labs:Vitamin D, Lipid panel, Hep C. Hgb A1-C, CBC with diff   Pap smear as above not taken   counseled on breast self exam, mammography screening, adequate intake of calcium and vitamin D, diet and exercise, Kegel's exercises  return annually or prn  An After Visit Summary was printed and given to the patient.

## 2015-12-05 LAB — HEMOGLOBIN A1C
HEMOGLOBIN A1C: 5.4 % (ref <5.7)
MEAN PLASMA GLUCOSE: 108 mg/dL

## 2015-12-05 LAB — HEPATITIS C ANTIBODY: HCV AB: NEGATIVE

## 2015-12-05 LAB — VITAMIN D 25 HYDROXY (VIT D DEFICIENCY, FRACTURES): VIT D 25 HYDROXY: 45 ng/mL (ref 30–100)

## 2015-12-07 NOTE — Progress Notes (Signed)
Encounter reviewed Kelsey Lumadue, MD   

## 2016-01-04 ENCOUNTER — Other Ambulatory Visit: Payer: Self-pay | Admitting: Certified Nurse Midwife

## 2016-01-04 DIAGNOSIS — N8110 Cystocele, unspecified: Secondary | ICD-10-CM

## 2016-01-04 DIAGNOSIS — N952 Postmenopausal atrophic vaginitis: Secondary | ICD-10-CM

## 2016-01-04 DIAGNOSIS — N816 Rectocele: Secondary | ICD-10-CM

## 2016-01-07 ENCOUNTER — Other Ambulatory Visit: Payer: Self-pay | Admitting: Certified Nurse Midwife

## 2016-01-07 DIAGNOSIS — Z1231 Encounter for screening mammogram for malignant neoplasm of breast: Secondary | ICD-10-CM

## 2016-02-08 ENCOUNTER — Ambulatory Visit
Admission: RE | Admit: 2016-02-08 | Discharge: 2016-02-08 | Disposition: A | Discharge status: Home / Self Care | Payer: 59 | Source: Ambulatory Visit | Attending: Certified Nurse Midwife | Admitting: Certified Nurse Midwife

## 2016-02-08 DIAGNOSIS — Z1231 Encounter for screening mammogram for malignant neoplasm of breast: Secondary | ICD-10-CM

## 2016-07-02 ENCOUNTER — Ambulatory Visit: Payer: 59 | Admitting: Gynecologic Oncology

## 2016-12-05 ENCOUNTER — Ambulatory Visit: Payer: Managed Care, Other (non HMO) | Admitting: Certified Nurse Midwife

## 2017-01-01 ENCOUNTER — Other Ambulatory Visit: Payer: Self-pay | Admitting: Certified Nurse Midwife

## 2017-01-01 DIAGNOSIS — Z1231 Encounter for screening mammogram for malignant neoplasm of breast: Secondary | ICD-10-CM

## 2017-01-06 ENCOUNTER — Ambulatory Visit (INDEPENDENT_AMBULATORY_CARE_PROVIDER_SITE_OTHER): Payer: 59 | Admitting: Certified Nurse Midwife

## 2017-01-06 ENCOUNTER — Encounter: Payer: Self-pay | Admitting: Certified Nurse Midwife

## 2017-01-06 VITALS — BP 118/76 | HR 68 | Resp 16 | Ht 66.25 in | Wt 183.0 lb

## 2017-01-06 DIAGNOSIS — Z01419 Encounter for gynecological examination (general) (routine) without abnormal findings: Secondary | ICD-10-CM | POA: Diagnosis not present

## 2017-01-06 DIAGNOSIS — Z659 Problem related to unspecified psychosocial circumstances: Secondary | ICD-10-CM

## 2017-01-06 DIAGNOSIS — N952 Postmenopausal atrophic vaginitis: Secondary | ICD-10-CM

## 2017-01-06 MED ORDER — ESTRADIOL 10 MCG VA TABS: ORAL_TABLET | VAGINAL | 4 refills | Status: DC

## 2017-01-06 NOTE — Progress Notes (Signed)
66 y.o. G70P2002 Married  Caucasian Fe here for annual exam. Menopausal no HRT. Denies vaginal bleeding or vaginal dryness. Has been a very tough year, with death of daughter-in-law from heart disease, she has 28 year old grandchild(live in [place]). Feels she has been overwhelmed with this and the world in general and gained weight in the last year. Using Vagifem for vaginal dryness, working well. No HSV 1 outbreaks in past year.  Plans to establish with PCP in the next year. Desires no labs today.  Plans to meet with son to see Granddaughter soon. No other health issues today.  Patient's last menstrual period was 03/11/1995.          Sexually active: Yes.    The current method of family planning is status post hysterectomy.    Exercising: Yes.    walking & yoga Smoker:  no  Health Maintenance: Pap:  2010 neg per patient History of Abnormal Pap: no MMG:  02-08-16 category b density birads 1:neg Self Breast exams: occ Colonoscopy:  2015 neg f/u 67yrs due to family hx BMD:   2016 TDaP:  2014 Shingles: 2015 Pneumonia: not done Hep C and HIV: Hep c neg 2017 Labs: if needed   reports that she has never smoked. She has never used smokeless tobacco. She reports that she does not drink alcohol or use drugs.  Past Medical History:  Diagnosis Date  . Fibroid   . Psoriasis   . Rib fracture   . Shingles times 3  . STD (sexually transmitted disease)    HSV1    Past Surgical History:  Procedure Laterality Date  . VAGINAL HYSTERECTOMY     ovaries retained  . WISDOM TOOTH EXTRACTION      Current Outpatient Prescriptions  Medication Sig Dispense Refill  . CALCIUM PO Take by mouth daily.    . Cholecalciferol (VITAMIN D PO) Take by mouth daily.    . Estradiol (VAGIFEM) 10 MCG TABS vaginal tablet INSERT 1 TABLET VAGINALLY 2TIMES A WEEK 24 tablet 4  . Multiple Vitamins-Minerals (ICAPS) CAPS Take by mouth daily.    Marland Kitchen EPINEPHrine 0.3 mg/0.3 mL IJ SOAJ injection Inject 0.3 mLs (0.3 mg total)  into the muscle once. (Patient not taking: Reported on 12/04/2015) 1 Device 1   No current facility-administered medications for this visit.     Family History  Problem Relation Age of Onset  . Cancer Mother        uterine & colon  . Diabetes Mother   . Macular degeneration Mother   . Hepatitis Father   . Deep vein thrombosis Sister   . Diabetes Paternal Grandmother   . Stroke Maternal Grandmother   . Angina Maternal Grandfather   . Heart disease Paternal Grandfather        pacemaker    ROS:  Pertinent items are noted in HPI.  Otherwise, a comprehensive ROS was negative.  Exam:   BP 118/76   Pulse 68   Resp 16   Ht 5' 6.25" (1.683 m)   Wt 183 lb (83 kg)   LMP 03/11/1995   BMI 29.31 kg/m  Height: 5' 6.25" (168.3 cm) Ht Readings from Last 3 Encounters:  01/06/17 5' 6.25" (1.683 m)  12/04/15 5\' 6"  (1.676 m)  11/29/14 5' 5.75" (1.67 m)    General appearance: alert, cooperative and appears stated age Head: Normocephalic, without obvious abnormality, atraumatic Neck: no adenopathy, supple, symmetrical, trachea midline and thyroid normal to inspection and palpation Lungs: clear to auscultation bilaterally Breasts:  normal appearance, no masses or tenderness, No nipple retraction or dimpling, No nipple discharge or bleeding, No axillary or supraclavicular adenopathy Heart: regular rate and rhythm Abdomen: soft, non-tender; no masses,  no organomegaly Extremities: extremities normal, atraumatic, no cyanosis or edema Skin: Skin color, texture, turgor normal. No rashes or lesions Lymph nodes: Cervical, supraclavicular, and axillary nodes normal. No abnormal inguinal nodes palpated Neurologic: Grossly normal   Pelvic: External genitalia:  no lesions              Urethra:  normal appearing urethra with no masses, tenderness or lesions              Bartholin's and Skene's: normal                 Vagina: normal appearing vagina with normal color and discharge, no lesions               Cervix: multiparous appearance, no cervical motion tenderness and no lesions              Pap taken: No. Bimanual Exam:  Uterus:  normal size, contour, position, consistency, mobility, non-tender              Adnexa: normal adnexa and no mass, fullness, tenderness               Rectovaginal: Confirms               Anus:  normal sphincter tone, no lesions  Chaperone present: yes  A:  Well Woman with normal exam  Menopausal no HRT , S/P TVH for bleeding with ovaries retained  Atrophic vaginitis with Vagifem use with good results  History of HSV 1 no medication needed  Social stress with death of daughter in law  Plans to establish with PCP in 2019       P:   Reviewed health and wellness pertinent to exam  Discussed risks/benefits of Vagifem, desires continuance  Rx Vagifem see order with instructions  Will call if needed  Encouraged to see grief counseling if needed with Hospice and with family. Try to find joy in granddaughter  Will call if assistance needed  Pap smear: no   counseled on breast self exam, mammography screening, feminine hygiene, menopause, osteoporosis, adequate intake of calcium and vitamin D, diet and exercise, Kegel's exercises  return annually or prn  An After Visit Summary was printed and given to the patient.

## 2017-01-06 NOTE — Patient Instructions (Signed)

## 2017-01-07 ENCOUNTER — Telehealth: Payer: Self-pay | Admitting: Certified Nurse Midwife

## 2017-01-07 DIAGNOSIS — N952 Postmenopausal atrophic vaginitis: Secondary | ICD-10-CM

## 2017-01-07 DIAGNOSIS — Z780 Asymptomatic menopausal state: Secondary | ICD-10-CM

## 2017-01-07 MED ORDER — ESTRADIOL 10 MCG VA TABS: ORAL_TABLET | VAGINAL | 4 refills | Status: DC

## 2017-01-07 NOTE — Telephone Encounter (Signed)
1. Patient called and requesting an order for a bone density be sent to [facility]. She has a mammography is on: 02/16/13  2. Patient requests Vagifem be sent [facility] instead of the local pharmacy.

## 2017-01-07 NOTE — Telephone Encounter (Signed)
Spoke with patient, advised order placed for BMD at [facility], f/u with [facility] for scheduling. Advised previous RX for Vagifem to [facility] d/c, new RX sent to [facility] as requested. Patient verbalizes understanding and is agreeable.  Routing to provider for final review. Patient is agreeable to disposition. Will close encounter.

## 2017-02-16 ENCOUNTER — Inpatient Hospital Stay: Admission: RE | Admit: 2017-02-16 | Payer: 59 | Source: Ambulatory Visit

## 2017-02-16 ENCOUNTER — Ambulatory Visit: Payer: 59

## 2017-02-23 ENCOUNTER — Ambulatory Visit: Payer: 59

## 2017-03-16 ENCOUNTER — Ambulatory Visit
Admission: RE | Admit: 2017-03-16 | Discharge: 2017-03-16 | Disposition: A | Discharge status: Home / Self Care | Payer: 59 | Source: Ambulatory Visit | Attending: Certified Nurse Midwife | Admitting: Certified Nurse Midwife

## 2017-03-16 DIAGNOSIS — Z1231 Encounter for screening mammogram for malignant neoplasm of breast: Secondary | ICD-10-CM

## 2017-03-16 DIAGNOSIS — Z780 Asymptomatic menopausal state: Secondary | ICD-10-CM

## 2017-06-18 DIAGNOSIS — L853 Xerosis cutis: Secondary | ICD-10-CM | POA: Diagnosis not present

## 2017-06-18 DIAGNOSIS — D485 Neoplasm of uncertain behavior of skin: Secondary | ICD-10-CM | POA: Diagnosis not present

## 2017-06-18 DIAGNOSIS — L814 Other melanin hyperpigmentation: Secondary | ICD-10-CM | POA: Diagnosis not present

## 2017-06-18 DIAGNOSIS — D1801 Hemangioma of skin and subcutaneous tissue: Secondary | ICD-10-CM | POA: Diagnosis not present

## 2017-06-18 DIAGNOSIS — L821 Other seborrheic keratosis: Secondary | ICD-10-CM | POA: Diagnosis not present

## 2017-06-18 DIAGNOSIS — L570 Actinic keratosis: Secondary | ICD-10-CM | POA: Diagnosis not present

## 2017-06-18 DIAGNOSIS — D225 Melanocytic nevi of trunk: Secondary | ICD-10-CM | POA: Diagnosis not present

## 2017-06-18 DIAGNOSIS — Z85828 Personal history of other malignant neoplasm of skin: Secondary | ICD-10-CM | POA: Diagnosis not present

## 2017-06-19 DIAGNOSIS — L570 Actinic keratosis: Secondary | ICD-10-CM | POA: Diagnosis not present

## 2017-06-19 DIAGNOSIS — D485 Neoplasm of uncertain behavior of skin: Secondary | ICD-10-CM | POA: Diagnosis not present

## 2017-07-02 DIAGNOSIS — L570 Actinic keratosis: Secondary | ICD-10-CM | POA: Diagnosis not present

## 2017-08-18 DIAGNOSIS — D485 Neoplasm of uncertain behavior of skin: Secondary | ICD-10-CM | POA: Diagnosis not present

## 2017-08-18 DIAGNOSIS — L738 Other specified follicular disorders: Secondary | ICD-10-CM | POA: Diagnosis not present

## 2017-08-19 DIAGNOSIS — L738 Other specified follicular disorders: Secondary | ICD-10-CM | POA: Diagnosis not present

## 2017-08-19 DIAGNOSIS — D485 Neoplasm of uncertain behavior of skin: Secondary | ICD-10-CM | POA: Diagnosis not present

## 2017-12-23 ENCOUNTER — Telehealth: Payer: Self-pay | Admitting: Family Medicine

## 2017-12-23 NOTE — Telephone Encounter (Signed)
Called and spoke with pt regarding moving her appt with Brigitte Pulse on 01/13/18 from 1:40 to 1:30. I advised of time, building number and late policy. Pt acknowledged.  OLD APPT: 1:40 NEW APPT : 1:30

## 2017-12-28 ENCOUNTER — Ambulatory Visit: Payer: 59 | Admitting: Family Medicine

## 2018-01-13 ENCOUNTER — Encounter: Payer: Self-pay | Admitting: Family Medicine

## 2018-01-13 ENCOUNTER — Other Ambulatory Visit: Payer: Self-pay

## 2018-01-13 ENCOUNTER — Ambulatory Visit (INDEPENDENT_AMBULATORY_CARE_PROVIDER_SITE_OTHER): Payer: Medicare Other | Admitting: Family Medicine

## 2018-01-13 VITALS — BP 109/75 | HR 88 | Temp 98.0°F | Resp 16 | Ht 65.95 in | Wt 183.0 lb

## 2018-01-13 DIAGNOSIS — Z23 Encounter for immunization: Secondary | ICD-10-CM | POA: Diagnosis not present

## 2018-01-13 DIAGNOSIS — Z7189 Other specified counseling: Secondary | ICD-10-CM | POA: Diagnosis not present

## 2018-01-13 DIAGNOSIS — Z8249 Family history of ischemic heart disease and other diseases of the circulatory system: Secondary | ICD-10-CM

## 2018-01-13 MED ORDER — ZOSTER VAC RECOMB ADJUVANTED 50 MCG/0.5ML IM SUSR: 0.5000 mL | Freq: Once | INTRAMUSCULAR | 1 refills | Status: AC

## 2018-01-13 NOTE — Patient Instructions (Addendum)
If you have lab work done today you will be contacted with your lab results within the next 2 weeks.  If you have not heard from [place] then please contact us. The fastest way to get your results is to register for My Chart.   IF you received an x-ray today, you will receive an invoice from [facility]. Please contact [facility] at [phone] with questions or concerns regarding your invoice.   IF you received labwork today, you will receive an invoice from [facility]. Please contact [facility] at [phone] with questions or concerns regarding your invoice.   Our billing staff will not be able to assist you with questions regarding bills from these companies.  You will be contacted with the lab results as soon as they are available. The fastest way to get your results is to activate your My Chart account. Instructions are located on the last page of this paperwork. If you have not heard from [place] regarding the results in 2 weeks, please contact this office.      Abdominal Aortic Aneurysm An aneurysm is a bulge in an artery. It happens when blood pushes up against a weakened or damaged artery wall. An abdominal aortic aneurysm is an aneurysm that occurs in the lower part of the aorta, the main artery of the body. The aorta supplies blood from the heart to the rest of the body. Some aneurysms may not cause symptoms or problems. However, the major concern with an abdominal aortic aneurysm is that it can enlarge and burst (rupture), or that blood can flow between the layers of the wall of the aorta through a tear (aorticdissection). Both of these conditions can cause bleeding inside the body and can be life-threatening unless diagnosed and treated right away. What are the causes? The exact cause of this condition is not known. What increases the risk? The following factors may make you more likely to develop this condition:  Being older than age 70.  Having a hardening of  the arteries caused by the buildup of fat and other substances in the lining of a blood vessel (arteriosclerosis).  Having inflammation of the walls of an artery (arteritis).  Having a genetic disease that weakens the body's connective tissue, such as Marfan syndrome.  Having abdominal trauma.  Having an infection, such as syphilis or staphylococcus, in the wall of the aorta (infectious aortitis) caused by bacteria.  Having high blood pressure (hypertension).  Being female.  Being white (Caucasian).  Having high cholesterol.  Having a family history of aneurysms.  Using tobacco.  Having chronic obstructive pulmonary disease (COPD).  What are the signs or symptoms? Symptoms of this condition vary depending on the size and rate of growth of the aneurysm.Most aneurysms grow slowly and do not cause any symptoms. When symptoms do occur, they may include:  Pain in the abdomen, side, or lower back. The pain may vary in intensity.  Feeling full after eating only small amounts of food.  Feeling a pulsating lump in the abdomen.  Symptoms that the aneurysm has ruptured include:  A sudden onset of severe pain in the abdomen, side, or lower back.  Nausea or vomiting.  Feeling faint or passing out.  How is this diagnosed? This condition may be diagnosed with:  A physical exam. During the exam, your health care provider will check for throbbing in your abdomen. He or she may also listen to the blood flow in your abdomen.  Tests, such as: ? An  ultrasound. ? X-rays. ? A CT scan. ? An MRI. ? Tests to check your arteries for damage or blockage (angiogram).  Because most unruptured abdominal aortic aneurysms cause no symptoms, they are often found during exams for other conditions. How is this treated? Treatment for this condition depends on:  The size of the aneurysm.  How fast the aneurysm is growing.  Your age.  Risk factors for rupture.  Aneurysms that are smaller than  2 inches (5 cm) may be managed by using medicines to control blood pressure, manage pain, or fight infection. You may need regular monitoring to see if the aneurysm is getting bigger. Your health care provider may recommend that you have an ultrasound every few years, every year, or every 3-6 months. How often you need to have an ultrasound depends on the size of the aneurysm, how fast it is growing, and whether you have a family history of aneurysms. Surgical repair may be needed if your aneurysm is larger than 2 inches (5 cm). Follow these instructions at home: General instructions  Keep all follow-up visits as told by your health care provider. This is important. ? Talk to your health care provider about regular screenings to see if the aneurysm is getting bigger.  Take over-the-counter and prescription medicines only as told by your health care provider.  Avoid heavy lifting and activities that take a lot of effort (are strenuous). Ask your health care provider what activities are safe for you. Lifestyle Follow instructions from your health care provider about healthy lifestyle habits. Your health care provider may recommend:  Not using any products that contain nicotine or tobacco, such as cigarettes and e-cigarettes. If you need help quitting, ask your health care provider.  Limiting or avoiding alcohol.  Keeping your blood pressure within normal limits. The target limit for most people is below 120/80. Check your blood pressure regularly. If it is high, ask your health care provider about ways that you can control it.  Keeping your blood sugar level and cholesterol levels within normal limits. Target limits for most people are: ? Blood sugar level: Less than 100 mg/dL. ? Total cholesterol level: Less than 200 mg/dL.  Eating a healthy diet. This may include: ? Lowering your salt (sodium) intake. In some people, too much salt can raise blood pressure and increase the risk of abdominal  aortic aneurysm. ? Avoiding foods that are high in saturated fat and cholesterol, such as red meat and dairy products. ? Eating a diet that is low in sugar. ? Increasing your fiber intake by including whole grains, vegetables, and fruits in your diet. Eating these foods may help to lower blood pressure.  Maintaining a healthy weight.  Staying physically active and exercising regularly. Talk with your health care provider about how often you should exercise and which types of exercise are safe for you.  Contact a health care provider if:  You have pain in your abdomen, side, or lower back.  You have a throbbing feeling in your abdomen.  You have a family history of aneurysms. Get help right away if:  You have sudden, severe pain in your abdomen or lower back.  You experience nausea or vomiting.  You have constipation or problems urinating.  You feel light-headed.  You have a rapid heart rate when you stand.  You have sweaty, clammy skin.  You have shortness of breath.  You have a fever. This information is not intended to replace advice given to you by your health  care provider. Make sure you discuss any questions you have with your health care provider. Document Released: 12/04/2004 Document Revised: 09/19/2015 Document Reviewed: 08/14/2015 Elsevier Interactive Patient Education  [employer].

## 2018-01-13 NOTE — Progress Notes (Signed)
Subjective:    Patient: Kelsey Barron "Kelsey Barron"  DOB: 1950/06/26; 67 y.o.   MRN: [id number]  Chief Complaint  Patient presents with  . Establish Care    HPI Father had AAA.   Walk every day and 600mg  of calcium and gets calcium in diet through yogurt every day.  Joined weight watchers in October.  March 2020 is colonoscopy - at [facility] done by Dr. Laurence Spates.   Still has ovaries but hysterectomy was for fibroids and menorrhagia at 67 yo. 1997.   Medical History Past Medical History:  Diagnosis Date  . Cancer ([place])    Basal Skin Cell ( 2002)  . Cataract   . Fibroid   . Psoriasis   . Rib fracture   . Shingles times 3  . STD (sexually transmitted disease)    HSV1   Past Surgical History:  Procedure Laterality Date  . VAGINAL HYSTERECTOMY     ovaries retained, fibroids bleeding  . WISDOM TOOTH EXTRACTION     Current Outpatient Medications on File Prior to Visit  Medication Sig Dispense Refill  . CALCIUM PO Take by mouth daily.    . Cholecalciferol (VITAMIN D PO) Take by mouth daily.    Marland Kitchen EPINEPHrine 0.3 mg/0.3 mL IJ SOAJ injection Inject 0.3 mLs (0.3 mg total) into the muscle once. (Patient not taking: Reported on 12/04/2015) 1 Device 1  . Estradiol (VAGIFEM) 10 MCG TABS vaginal tablet INSERT 1 TABLET VAGINALLY 2TIMES A WEEK 24 tablet 4  . Multiple Vitamins-Minerals (ICAPS) CAPS Take by mouth daily.     No current facility-administered medications on file prior to visit.    No Known Allergies Family History  Problem Relation Age of Onset  . Cancer Mother        uterine & colon  . Diabetes Mother   . Macular degeneration Mother   . Dementia Mother   . Hepatitis Father   . Deep vein thrombosis Sister   . Diabetes Paternal Grandmother   . Stroke Maternal Grandmother   . Angina Maternal Grandfather   . Heart disease Paternal Grandfather        pacemaker   Social History   Socioeconomic History  . Marital status: Married    Spouse name: Not on file  .  Number of children: Not on file  . Years of education: Not on file  . Highest education level: Not on file  Occupational History  . Not on file  Social Needs  . Financial resource strain: Not on file  . Food insecurity:    Worry: Not on file    Inability: Not on file  . Transportation needs:    Medical: Not on file    Non-medical: Not on file  Tobacco Use  . Smoking status: Never Smoker  . Smokeless tobacco: Never Used  Substance and Sexual Activity  . Alcohol use: No  . Drug use: No  . Sexual activity: Yes    Partners: Male    Birth control/protection: Surgical    Comment: TVH  Lifestyle  . Physical activity:    Days per week: Not on file    Minutes per session: Not on file  . Stress: Not on file  Relationships  . Social connections:    Talks on phone: Not on file    Gets together: Not on file    Attends religious service: Not on file    Active member of club or organization: Not on file    Attends meetings of  clubs or organizations: Not on file    Relationship status: Not on file  Other Topics Concern  . Not on file  Social History Narrative  . Not on file   Depression screen [facility] 2/9 01/13/2018  Decreased Interest 0  Down, Depressed, Hopeless 0  PHQ - 2 Score 0    ROS As noted in HPI  Objective:  BP 109/75   Pulse 88   Temp 98 F (36.7 C) (Oral)   Resp 16   Ht 5' 5.95" (1.675 m)   Wt 183 lb (83 kg)   LMP 03/11/1995   SpO2 96%   BMI 29.59 kg/m  Physical Exam  Constitutional: She is oriented to person, place, and time. She appears well-developed and well-nourished. No distress.  HENT:  Head: Normocephalic and atraumatic.  Right Ear: External ear normal.  Left Ear: External ear normal.  Eyes: Conjunctivae are normal. No scleral icterus.  Neck: Normal range of motion. Neck supple. No thyromegaly present.  Cardiovascular: Normal rate, regular rhythm, normal heart sounds and intact distal pulses.  Pulmonary/Chest: Effort normal and breath sounds  normal. No respiratory distress.  Musculoskeletal: She exhibits no edema.  Lymphadenopathy:    She has no cervical adenopathy.  Neurological: She is alert and oriented to person, place, and time.  Skin: Skin is warm and dry. She is not diaphoretic. No erythema.  Psychiatric: She has a normal mood and affect. Her behavior is normal.     Assessment & Plan:   1. Family history of abdominal aortic aneurysm (AAA)     Patient will continue on current chronic medications other than changes noted above, so ok to refill when needed.   See after visit summary for patient specific instructions.  Orders Placed This Encounter  Procedures  . Pneumococcal conjugate vaccine 13-valent IM    Meds ordered this encounter  Medications  . Zoster Vaccine Adjuvanted [facility]) injection    Sig: Inject 0.5 mLs into the muscle once for 1 dose. Repeat once in 2-6 months    Dispense:  0.5 mL    Refill:  1    Patient verbalized to me that they understand the following: diagnosis, what is being done for them, what to expect and what should be done at home.  Their questions have been answered. They understand that I am unable to predict every possible medication interaction or adverse outcome and that if any unexpected symptoms arise, they should contact us and their pharmacist, as well as never hesitate to seek urgent/emergent care at [facility] or ER if they think it might be warranted.    Delman Cheadle, MD, MPH Primary Care at [facility] [place], [place]  [phone] Office phone  [phone] Office fax  01/13/18 3:18 PM

## 2018-01-14 ENCOUNTER — Ambulatory Visit: Payer: 59 | Admitting: Certified Nurse Midwife

## 2018-01-19 DIAGNOSIS — H3561 Retinal hemorrhage, right eye: Secondary | ICD-10-CM | POA: Diagnosis not present

## 2018-01-21 ENCOUNTER — Encounter: Payer: Self-pay | Admitting: Certified Nurse Midwife

## 2018-01-21 ENCOUNTER — Ambulatory Visit (INDEPENDENT_AMBULATORY_CARE_PROVIDER_SITE_OTHER): Payer: Medicare Other | Admitting: Certified Nurse Midwife

## 2018-01-21 ENCOUNTER — Other Ambulatory Visit: Payer: Self-pay

## 2018-01-21 VITALS — BP 116/80 | Ht 65.75 in | Wt 182.0 lb

## 2018-01-21 DIAGNOSIS — Z124 Encounter for screening for malignant neoplasm of cervix: Secondary | ICD-10-CM

## 2018-01-21 DIAGNOSIS — Z780 Asymptomatic menopausal state: Secondary | ICD-10-CM

## 2018-01-21 DIAGNOSIS — N952 Postmenopausal atrophic vaginitis: Secondary | ICD-10-CM | POA: Diagnosis not present

## 2018-01-21 DIAGNOSIS — Z01419 Encounter for gynecological examination (general) (routine) without abnormal findings: Secondary | ICD-10-CM

## 2018-01-21 MED ORDER — ESTRADIOL 10 MCG VA TABS: ORAL_TABLET | VAGINAL | 4 refills | Status: DC

## 2018-01-21 NOTE — Patient Instructions (Signed)

## 2018-01-21 NOTE — Progress Notes (Signed)
67 y.o. G36P2002 Married  Caucasian Fe here for annual exam. Menopausal no HRT. Denies vaginal bleeding or vaginal dryness. Sees Dr. Brigitte Pulse for aex, labs. Using vagifem tablets with good results with dryness. No HSV 1 outbreaks. Social stress with mother's death. Still dealing with daughter in [place] death. Son and 3 year old live in [place] now. Recent death of mother 24 with dementia. Emotionally doing well. No health issues today.  Patient's last menstrual period was 03/11/1995.          Sexually active: Yes.    The current method of family planning is status post hysterectomy.    Exercising: Yes.    walking Smoker:  no  Review of Systems  Constitutional: Negative.   HENT: Negative.   Eyes: Negative.   Respiratory: Negative.   Cardiovascular: Negative.   Gastrointestinal: Negative.   Genitourinary: Negative.   Musculoskeletal: Negative.   Skin: Negative.   Neurological: Negative.   Endo/Heme/Allergies: Negative.   Psychiatric/Behavioral: Negative.     Health Maintenance: Pap:  2010 neg per patient History of Abnormal Pap: no MMG:  03-16-17 category b density birads 1:neg Self Breast exams: occ Colonoscopy: 2015 f/u 27yrs due to family hx BMD:   2019 normal TDaP:  2014 Shingles: 2015 Pneumonia: 2019 Hep C and HIV: hep c neg 2017 Labs: if needed   reports that she has never smoked. She has never used smokeless tobacco. She reports that she does not drink alcohol or use drugs.  Past Medical History:  Diagnosis Date  . Cancer ([place])    Basal Skin Cell ( 2002)  . Cataract   . Fibroid   . Psoriasis   . Rib fracture   . Shingles times 3  . STD (sexually transmitted disease)    HSV1    Past Surgical History:  Procedure Laterality Date  . VAGINAL HYSTERECTOMY     ovaries retained  . WISDOM TOOTH EXTRACTION      Current Outpatient Medications  Medication Sig Dispense Refill  . Calcium Citrate-Vitamin D (CALCIUM + D PO) Take 600 mg by mouth.    . Estradiol (VAGIFEM) 10  MCG TABS vaginal tablet INSERT 1 TABLET VAGINALLY 2TIMES A WEEK 24 tablet 4  . Fluocinolone Acetonide Scalp (DERMA-SMOOTHE/FS SCALP) 0.01 % OIL Apply topically.    . Multiple Vitamins-Minerals (ICAPS) CAPS Take by mouth daily.    Marland Kitchen VITAMIN D PO Take by mouth.    . Wheat Dextrin (BENEFIBER ON THE GO) POWD Take by mouth.    . EPINEPHrine 0.3 mg/0.3 mL IJ SOAJ injection Inject 0.3 mLs (0.3 mg total) into the muscle once. (Patient not taking: Reported on 01/21/2018) 1 Device 1  . valACYclovir (VALTREX) 500 MG tablet Take 500 mg by mouth 2 (two) times daily.     No current facility-administered medications for this visit.     Family History  Problem Relation Age of Onset  . Cancer Mother        uterine & colon  . Diabetes Mother   . Macular degeneration Mother   . Dementia Mother   . Hepatitis Father   . Deep vein thrombosis Sister   . Diabetes Paternal Grandmother   . Stroke Maternal Grandmother   . Angina Maternal Grandfather   . Heart disease Paternal Grandfather        pacemaker   Exam:   BP 116/80   Ht 5' 5.75" (1.67 m)   Wt 182 lb (82.6 kg)   LMP 03/11/1995   BMI 29.60 kg/m  Height: 5' 5.75" (167 cm) Ht Readings from Last 3 Encounters:  01/21/18 5' 5.75" (1.67 m)  01/13/18 5' 5.95" (1.675 m)  01/06/17 5' 6.25" (1.683 m)    General appearance: alert, cooperative and appears stated age Head: Normocephalic, without obvious abnormality, atraumatic Neck: no adenopathy, supple, symmetrical, trachea midline and thyroid normal to inspection and palpation Lungs: clear to auscultation bilaterally Breasts: normal appearance, no masses or tenderness, No nipple retraction or dimpling, No nipple discharge or bleeding, No axillary or supraclavicular adenopathy Heart: regular rate and rhythm Abdomen: soft, non-tender; no masses,  no organomegaly Extremities: extremities normal, atraumatic, no cyanosis or edema Skin: Skin color, texture, turgor normal. No rashes or lesions Lymph  nodes: Cervical, supraclavicular, and axillary nodes normal. No abnormal inguinal nodes palpated Neurologic: Grossly normal   Pelvic: External genitalia:  no lesions              Urethra:  normal appearing urethra with no masses, tenderness or lesions              Bartholin's and Skene's: normal                 Vagina: normal appearing vagina with normal color and discharge, no lesions              Cervix: absent              Pap taken: No. Bimanual Exam:  Uterus:  uterus absent              Adnexa: normal adnexa and no mass, fullness, tenderness               Rectovaginal: Confirms               Anus:  normal sphincter tone, no lesions  Chaperone present: yes  A:  Well Woman with normal exam  Menopausal no HRT s/p TAH for fibroids bleeding, ovaries retained  Atrophic vaginitis Vagifem working well  Cystocele with rectocele known no change   PCP exam yearly/labs  HSV 1 history, no Rx needed  P:   Reviewed health and wellness pertinent to exam  Discussed risks/benefits/warning signs with Vagifem use. Desires continuance.  Rx Vagifem see order with instructions  Stressed importance of good bladder and bowel habits. Work on [employer] exercise.  Continue follow up as indicated.  Pap smear: no   counseled on breast self exam, mammography screening, feminine hygiene, adequate intake of calcium and vitamin D, diet and exercise  return annually or prn  An After Visit Summary was printed and given to the patient.

## 2018-01-22 ENCOUNTER — Ambulatory Visit (HOSPITAL_COMMUNITY)
Admission: RE | Admit: 2018-01-22 | Discharge: 2018-01-22 | Disposition: A | Discharge status: Home / Self Care | Payer: Medicare Other | Source: Ambulatory Visit | Attending: Family Medicine | Admitting: Family Medicine

## 2018-01-22 DIAGNOSIS — Z8249 Family history of ischemic heart disease and other diseases of the circulatory system: Secondary | ICD-10-CM | POA: Insufficient documentation

## 2018-01-25 ENCOUNTER — Other Ambulatory Visit: Payer: Self-pay | Admitting: Family Medicine

## 2018-01-25 NOTE — Telephone Encounter (Signed)
Copied from [place] [id number]. Topic: Quick Communication - Rx Refill/Question >> Jan 25, 2018 10:50 AM Charlynn Court wrote: Medication: Shingles Vaccine  Has the patient contacted their pharmacy? Yes.   (Agent: If no, request that the patient contact the pharmacy for the refill.) (Agent: If yes, when and what did the pharmacy advise?)  Preferred Pharmacy (with phone number or street name): [id number] [place], [place]: Please be advised that RX refills may take up to 3 business days. We ask that you follow-up with your pharmacy.   Vaccine was sent to mail order instead of local pharmacy: [id number] - [place], [place]

## 2018-01-26 NOTE — Telephone Encounter (Signed)
Shingles vaccine was sent to mail order instead of local pharmacy. Please send to local pharmacy.Thanks.

## 2018-01-28 ENCOUNTER — Other Ambulatory Visit: Payer: Self-pay | Admitting: *Deleted

## 2018-01-28 DIAGNOSIS — Z23 Encounter for immunization: Secondary | ICD-10-CM

## 2018-01-28 NOTE — Telephone Encounter (Signed)
Tried to order Shingrix unable to send to pharmacy

## 2018-02-01 ENCOUNTER — Other Ambulatory Visit: Payer: Self-pay | Admitting: Certified Nurse Midwife

## 2018-02-01 DIAGNOSIS — Z1231 Encounter for screening mammogram for malignant neoplasm of breast: Secondary | ICD-10-CM

## 2018-02-16 NOTE — Telephone Encounter (Signed)
Patient called in and was told from pharmacy that she wasn't on the list for vaccine and they havent received a script.  [id number] [address], [place] [phone] (Phone) [phone] (Fax)

## 2018-02-16 NOTE — Telephone Encounter (Signed)
Please advise, Patient need an order sent into the pharm to receive the shingle vaccine

## 2018-02-17 NOTE — Telephone Encounter (Signed)
Pt called in about the shingrix vaccine script. It needs to go to  [id number] - [place], [place] [phone] (Phone) [phone] (Fax)   Pt has been waiting on this request since November/ [facility] please advise

## 2018-02-19 NOTE — Telephone Encounter (Signed)
Dr Shaw please advise. Dgaddy, CMA 

## 2018-03-22 ENCOUNTER — Ambulatory Visit
Admission: RE | Admit: 2018-03-22 | Discharge: 2018-03-22 | Disposition: A | Discharge status: Home / Self Care | Payer: 59 | Source: Ambulatory Visit | Attending: Certified Nurse Midwife | Admitting: Certified Nurse Midwife

## 2018-03-22 DIAGNOSIS — Z1231 Encounter for screening mammogram for malignant neoplasm of breast: Secondary | ICD-10-CM

## 2018-04-26 ENCOUNTER — Ambulatory Visit (INDEPENDENT_AMBULATORY_CARE_PROVIDER_SITE_OTHER): Payer: Medicare Other | Admitting: Family Medicine

## 2018-04-26 VITALS — BP 110/76 | Ht 66.0 in | Wt 168.2 lb

## 2018-04-26 DIAGNOSIS — Z0000 Encounter for general adult medical examination without abnormal findings: Secondary | ICD-10-CM | POA: Diagnosis not present

## 2018-04-26 NOTE — Progress Notes (Signed)
[employer]ne[employer] treadmill [employer] 45 minutes everyday Doing [employer]    [place] [facility] once a year. Will call for Colonoscopy in March.    30 second get up   One level home two cats and husband.  Free off scattered rugs, well lit, grab bars in bathroom.      Presents today for Medicare Annual Wellness Visit   Patient Care Team: Shawnee Knapp, MD as PCP - General (Family Medicine) Regina Eck, CNM as Referring Physician (Certified Nurse Midwife) Devra Dopp, MD as Referring Physician (Dermatology) Laurence Spates, MD as Consulting Physician (Gastroenterology)    Immunization status:  Immunization History  Administered Date(s) Administered  . Influenza-Unspecified 12/01/2016, 11/26/2017  . Pneumococcal Conjugate-13 01/13/2018  . Tdap 11/22/2012  . Zoster 11/08/2013     Health Maintenance Due  Topic Date Due  . HIV Screening  05/28/1965     Functional Status Survey: Is the patient deaf or have difficulty hearing?: No Does the patient have difficulty seeing, even when wearing glasses/contacts?: No Does the patient have difficulty concentrating, remembering, or making decisions?: No Does the patient have difficulty walking or climbing stairs?: No Does the patient have difficulty dressing or bathing?: No Does the patient have difficulty doing errands alone such as visiting a doctor's office or shopping?: No   6CIT Screen 04/26/2018  What Year? 0 points  What month? 0 points  What time? 0 points  Count back from 20 0 points  Months in reverse 0 points  Repeat phrase 0 points  Total Score 0        Clinical Support from 04/26/2018 in Primary Care at [facility]  AUDIT-C Score  0      There are no active problems to display for this patient.    Past Medical History:  Diagnosis Date  . Cancer ([place])    Basal Skin Cell ( 2002)  . Cataract   . Fibroid   . Psoriasis   . Rib fracture   . Shingles times 3  . STD (sexually  transmitted disease)    HSV1     Past Surgical History:  Procedure Laterality Date  . VAGINAL HYSTERECTOMY     ovaries retained, fibroids bleeding  . WISDOM TOOTH EXTRACTION       Family History  Problem Relation Age of Onset  . Cancer Mother        uterine & colon  . Diabetes Mother   . Macular degeneration Mother   . Dementia Mother   . Hepatitis Father   . Deep vein thrombosis Sister   . Diabetes Paternal Grandmother   . Stroke Maternal Grandmother   . Angina Maternal Grandfather   . Heart disease Paternal Grandfather        pacemaker  . Breast cancer Neg Hx      Social History   Socioeconomic History  . Marital status: Married    Spouse name: Not on file  . Number of children: Not on file  . Years of education: Not on file  . Highest education level: Not on file  Occupational History  . Not on file  Social Needs  . Financial resource strain: Not on file  . Food insecurity:    Worry: Not on file    Inability: Not on file  . Transportation needs:    Medical: Not on file    Non-medical: Not on file  Tobacco Use  . Smoking status: Never Smoker  . Smokeless tobacco: Never Used  Substance and Sexual Activity  . Alcohol use: No  . Drug use: No  . Sexual activity: Yes    Partners: Male    Birth control/protection: Surgical    Comment: [facility]  Lifestyle  . Physical activity:    Days per week: Not on file    Minutes per session: Not on file  . Stress: Not on file  Relationships  . Social connections:    Talks on phone: Not on file    Gets together: Not on file    Attends religious service: Not on file    Active member of club or organization: Not on file    Attends meetings of clubs or organizations: Not on file    Relationship status: Not on file  . Intimate partner violence:    Fear of current or ex partner: Not on file    Emotionally abused: Not on file    Physically abused: Not on file    Forced sexual activity: Not on file  Other Topics Concern   . Not on file  Social History Narrative  . Not on file     No Known Allergies   Prior to Admission medications   Medication Sig Start Date End Date Taking? Authorizing Provider  Calcium Citrate-Vitamin D (CALCIUM + D PO) Take 600 mg by mouth.   Yes [provider]  EPINEPHrine 0.3 mg/0.3 mL IJ SOAJ injection Inject 0.3 mLs (0.3 mg total) into the muscle once. 11/23/13  Yes Regina Eck, CNM  Estradiol (VAGIFEM) 10 MCG TABS vaginal tablet INSERT 1 TABLET VAGINALLY 2TIMES A WEEK 01/21/18  Yes Regina Eck, CNM  Fluocinolone Acetonide Scalp (DERMA-SMOOTHE/FS SCALP) 0.01 % OIL Apply topically.   Yes [provider]  Multiple Vitamins-Minerals (ICAPS) CAPS Take by mouth daily.   Yes [provider]  valACYclovir (VALTREX) 500 MG tablet Take 500 mg by mouth 2 (two) times daily.   Yes [provider]  VITAMIN D PO Take by mouth.   Yes [provider]  Wheat Dextrin (BENEFIBER ON THE GO) POWD Take by mouth.   Yes [provider]     Depression screen [facility] 2/9 04/26/2018 01/13/2018  Decreased Interest 0 0  Down, Depressed, Hopeless 0 0  PHQ - 2 Score 0 0     Fall Risk  04/26/2018 01/13/2018  Falls in the past year? 0 0  Number falls in past yr: 0 0  Injury with Fall? 0 0  Follow up Falls evaluation completed;Falls prevention discussed -      PHYSICAL EXAM: BP 110/76   Ht 5\' 6"  (1.676 m)   Wt 168 lb 3.2 oz (76.3 kg)   LMP 03/11/1995   BMI 27.15 kg/m    Wt Readings from Last 3 Encounters:  04/26/18 168 lb 3.2 oz (76.3 kg)  01/21/18 182 lb (82.6 kg)  01/13/18 183 lb (83 kg)     No exam data present    Physical Exam   Education/Counseling provided regarding diet and exercise, prevention of chronic diseases, smoking/tobacco cessation, if applicable, and reviewed "Covered Medicare Preventive Services."   ASSESSMENT/PLAN: There are no diagnoses linked to this encounter.

## 2018-04-26 NOTE — Patient Instructions (Signed)
Thank you for taking time to come for your Medicare Wellness Visit. I appreciate your ongoing commitment to your health goals. Please review the following plan we discussed and let me know if I can assist you in the future.  Julie Greer LPN  Healthy Eating Following a healthy eating pattern may help you to achieve and maintain a healthy body weight, reduce the risk of chronic disease, and live a long and productive life. It is important to follow a healthy eating pattern at an appropriate calorie level for your body. Your nutritional needs should be met primarily through food by choosing a variety of nutrient-rich foods. What are tips for following this plan? Reading food labels  Read labels and choose the following: ? Reduced or low sodium. ? Juices with 100% fruit juice. ? Foods with low saturated fats and high polyunsaturated and monounsaturated fats. ? Foods with whole grains, such as whole wheat, cracked wheat, brown rice, and wild rice. ? Whole grains that are fortified with folic acid. This is recommended for women who are pregnant or who want to become pregnant.  Read labels and avoid the following: ? Foods with a lot of added sugars. These include foods that contain brown sugar, corn sweetener, corn syrup, dextrose, fructose, glucose, high-fructose corn syrup, honey, invert sugar, lactose, malt syrup, maltose, molasses, raw sugar, sucrose, trehalose, or turbinado sugar.  Do not eat more than the following amounts of added sugar per day:  6 teaspoons (25 g) for women.  9 teaspoons (38 g) for men. ? Foods that contain processed or refined starches and grains. ? Refined grain products, such as white flour, degermed cornmeal, white bread, and white rice. Shopping  Choose nutrient-rich snacks, such as vegetables, whole fruits, and nuts. Avoid high-calorie and high-sugar snacks, such as potato chips, fruit snacks, and candy.  Use oil-based dressings and spreads on foods instead of  solid fats such as butter, stick margarine, or cream cheese.  Limit pre-made sauces, mixes, and "instant" products such as flavored rice, instant noodles, and ready-made pasta.  Try more plant-protein sources, such as tofu, tempeh, black beans, edamame, lentils, nuts, and seeds.  Explore eating plans such as the Mediterranean diet or vegetarian diet. Cooking  Use oil to saut or stir-fry foods instead of solid fats such as butter, stick margarine, or lard.  Try baking, boiling, grilling, or broiling instead of frying.  Remove the fatty part of meats before cooking.  Steam vegetables in water or broth. Meal planning   At meals, imagine dividing your plate into fourths: ? One-half of your plate is fruits and vegetables. ? One-fourth of your plate is whole grains. ? One-fourth of your plate is protein, especially lean meats, poultry, eggs, tofu, beans, or nuts.  Include low-fat dairy as part of your daily diet. Lifestyle  Choose healthy options in all settings, including home, work, school, restaurants, or stores.  Prepare your food safely: ? Wash your hands after handling raw meats. ? Keep food preparation surfaces clean by regularly washing with hot, soapy water. ? Keep raw meats separate from ready-to-eat foods, such as fruits and vegetables. ? Cook seafood, meat, poultry, and eggs to the recommended internal temperature. ? Store foods at safe temperatures. In general:  Keep cold foods at 40F (4.4C) or below.  Keep hot foods at 140F (60C) or above.  Keep your freezer at 0F (-17.8C) or below.  Foods are no longer safe to eat when they have been between the temperatures of 40-140F (4.4-60C) for   more than 2 hours. What foods should I eat? Fruits Aim to eat 2 cup-equivalents of fresh, canned (in natural juice), or frozen fruits each day. Examples of 1 cup-equivalent of fruit include 1 small apple, 8 large strawberries, 1 cup canned fruit,  cup dried fruit, or 1 cup  100% juice. Vegetables Aim to eat 2-3 cup-equivalents of fresh and frozen vegetables each day, including different varieties and colors. Examples of 1 cup-equivalent of vegetables include 2 medium carrots, 2 cups raw, leafy greens, 1 cup chopped vegetable (raw or cooked), or 1 medium baked potato. Grains Aim to eat 6 ounce-equivalents of whole grains each day. Examples of 1 ounce-equivalent of grains include 1 slice of bread, 1 cup ready-to-eat cereal, 3 cups popcorn, or  cup cooked rice, pasta, or cereal. Meats and other proteins Aim to eat 5-6 ounce-equivalents of protein each day. Examples of 1 ounce-equivalent of protein include 1 egg, 1/2 cup nuts or seeds, or 1 tablespoon (16 g) peanut butter. A cut of meat or fish that is the size of a deck of cards is about 3-4 ounce-equivalents.  Of the protein you eat each week, try to have at least 8 ounces come from seafood. This includes salmon, trout, herring, and anchovies. Dairy Aim to eat 3 cup-equivalents of fat-free or low-fat dairy each day. Examples of 1 cup-equivalent of dairy include 1 cup (240 mL) milk, 8 ounces (250 g) yogurt, 1 ounces (44 g) natural cheese, or 1 cup (240 mL) fortified soy milk. Fats and oils  Aim for about 5 teaspoons (21 g) per day. Choose monounsaturated fats, such as canola and olive oils, avocados, peanut butter, and most nuts, or polyunsaturated fats, such as sunflower, corn, and soybean oils, walnuts, pine nuts, sesame seeds, sunflower seeds, and flaxseed. Beverages  Aim for six 8-oz glasses of water per day. Limit coffee to three to five 8-oz cups per day.  Limit caffeinated beverages that have added calories, such as soda and energy drinks.  Limit alcohol intake to no more than 1 drink a day for nonpregnant women and 2 drinks a day for men. One drink equals 12 oz of beer (355 mL), 5 oz of wine (148 mL), or 1 oz of hard liquor (44 mL). Seasoning and other foods  Avoid adding excess amounts of salt to your  foods. Try flavoring foods with herbs and spices instead of salt.  Avoid adding sugar to foods.  Try using oil-based dressings, sauces, and spreads instead of solid fats. This information is based on general [place] nutrition guidelines. For more information, visit choosemyplate.gov. Exact amounts may vary based on your nutrition needs. Summary  A healthy eating plan may help you to maintain a healthy weight, reduce the risk of chronic diseases, and stay active throughout your life.  Plan your meals. Make sure you eat the right portions of a variety of nutrient-rich foods.  Try baking, boiling, grilling, or broiling instead of frying.  Choose healthy options in all settings, including home, work, school, restaurants, or stores. This information is not intended to replace advice given to you by your health care provider. Make sure you discuss any questions you have with your health care provider. Document Released: 06/08/2017 Document Revised: 06/08/2017 Document Reviewed: 06/08/2017 Elsevier Interactive Patient Education  2019 Elsevier Inc.  

## 2018-05-20 ENCOUNTER — Encounter: Payer: Self-pay | Admitting: *Deleted

## 2018-05-24 ENCOUNTER — Telehealth: Payer: Self-pay | Admitting: Certified Nurse Midwife

## 2018-05-24 DIAGNOSIS — N952 Postmenopausal atrophic vaginitis: Secondary | ICD-10-CM

## 2018-05-24 MED ORDER — ESTRADIOL 10 MCG VA TABS: ORAL_TABLET | VAGINAL | 2 refills | Status: DC

## 2018-05-24 NOTE — Telephone Encounter (Signed)
Medication refill request:Estradiol  Last AEX:  01/21/18 Next AEX:  01/26/19 Last MMG (if hormonal medication request): 03/22/18 Bi-rads 1 neg Refill authorized: # 24 and 2 rf  Patient had a change in [employer] and is needing new script sent to [facility].

## 2018-05-24 NOTE — Telephone Encounter (Signed)
Prescription has been called into [facility] for #90 w/2 refills. Patient has been notified and stated that [facility] stated that she is no longer in their system so they will not be sending any further refills. Closing encounter

## 2018-05-24 NOTE — Telephone Encounter (Signed)
Patient is asking for a 90 day refill of Estradiol to [facility] due to the change in her prescription coverage with her insurance.

## 2018-08-11 NOTE — Progress Notes (Signed)
I was was available for the history and physical exam. I personally discussed this patient at the time of the office visit and agree with the assessment and plan.

## 2018-09-13 ENCOUNTER — Encounter: Payer: Self-pay | Admitting: Family Medicine

## 2018-09-14 LAB — HM COLONOSCOPY

## 2018-10-11 ENCOUNTER — Encounter: Payer: Self-pay | Admitting: Family Medicine

## 2018-10-11 DIAGNOSIS — K5730 Diverticulosis of large intestine without perforation or abscess without bleeding: Secondary | ICD-10-CM | POA: Insufficient documentation

## 2018-10-11 DIAGNOSIS — D369 Benign neoplasm, unspecified site: Secondary | ICD-10-CM | POA: Insufficient documentation

## 2018-12-20 ENCOUNTER — Ambulatory Visit (INDEPENDENT_AMBULATORY_CARE_PROVIDER_SITE_OTHER): Payer: Medicare Other | Admitting: Family Medicine

## 2018-12-20 ENCOUNTER — Other Ambulatory Visit: Payer: Self-pay

## 2018-12-20 DIAGNOSIS — Z13220 Encounter for screening for lipoid disorders: Secondary | ICD-10-CM

## 2018-12-20 DIAGNOSIS — Z131 Encounter for screening for diabetes mellitus: Secondary | ICD-10-CM

## 2018-12-20 DIAGNOSIS — Z1329 Encounter for screening for other suspected endocrine disorder: Secondary | ICD-10-CM

## 2018-12-20 DIAGNOSIS — Z130 Encounter for screening for diseases of the blood and blood-forming organs and certain disorders involving the immune mechanism: Secondary | ICD-10-CM

## 2018-12-20 DIAGNOSIS — Z13228 Encounter for screening for other metabolic disorders: Secondary | ICD-10-CM

## 2018-12-21 LAB — COMPREHENSIVE METABOLIC PANEL
ALT: 13 IU/L (ref 0–32)
AST: 17 IU/L (ref 0–40)
Albumin/Globulin Ratio: 1.6 (ref 1.2–2.2)
Albumin: 4.2 g/dL (ref 3.8–4.8)
Alkaline Phosphatase: 55 IU/L (ref 39–117)
BUN/Creatinine Ratio: 26 (ref 12–28)
BUN: 23 mg/dL (ref 8–27)
Bilirubin Total: 0.7 mg/dL (ref 0.0–1.2)
CO2: 24 mmol/L (ref 20–29)
Calcium: 9.6 mg/dL (ref 8.7–10.3)
Chloride: 104 mmol/L (ref 96–106)
Creatinine, Ser: 0.88 mg/dL (ref 0.57–1.00)
GFR calc Af Amer: 79 mL/min/{1.73_m2} (ref >59)
GFR calc non Af Amer: 69 mL/min/{1.73_m2} (ref >59)
Globulin, Total: 2.7 g/dL (ref 1.5–4.5)
Glucose: 88 mg/dL (ref 65–99)
Potassium: 4.2 mmol/L (ref 3.5–5.2)
Sodium: 141 mmol/L (ref 134–144)
Total Protein: 6.9 g/dL (ref 6.0–8.5)

## 2018-12-21 LAB — LIPID PANEL
Chol/HDL Ratio: 3.6 ratio (ref 0.0–4.4)
Cholesterol, Total: 184 mg/dL (ref 100–199)
HDL: 51 mg/dL (ref >39)
LDL Chol Calc (NIH): 116 mg/dL — ABNORMAL HIGH (ref 0–99)
Triglycerides: 92 mg/dL (ref 0–149)
VLDL Cholesterol Cal: 17 mg/dL (ref 5–40)

## 2018-12-21 LAB — CBC
Hematocrit: 42.1 % (ref 34.0–46.6)
Hemoglobin: 14 g/dL (ref 11.1–15.9)
MCH: 31.7 pg (ref 26.6–33.0)
MCHC: 33.3 g/dL (ref 31.5–35.7)
MCV: 96 fL (ref 79–97)
Platelets: 238 10*3/uL (ref 150–450)
RBC: 4.41 x10E6/uL (ref 3.77–5.28)
RDW: 12.1 % (ref 11.7–15.4)
WBC: 3.5 10*3/uL (ref 3.4–10.8)

## 2018-12-21 LAB — TSH: TSH: 3.05 u[IU]/mL (ref 0.450–4.500)

## 2018-12-27 ENCOUNTER — Other Ambulatory Visit: Payer: Self-pay | Admitting: *Deleted

## 2018-12-27 NOTE — Telephone Encounter (Signed)
Message left to return call to [facility] at [phone].   Need patient to advise if she will need a refill of Vagifem prior to appointment on 01-26-2019.

## 2018-12-27 NOTE — Telephone Encounter (Signed)
Patient returned call. Patient states she does not need a refill of vagifem prior to appointment on 01-26-2019. States "it can wait until appointment."   Refill denied and faxed to [facility].   Encounter closed.

## 2018-12-28 ENCOUNTER — Other Ambulatory Visit: Payer: Self-pay

## 2018-12-28 ENCOUNTER — Telehealth (INDEPENDENT_AMBULATORY_CARE_PROVIDER_SITE_OTHER): Payer: Medicare Other | Admitting: Family Medicine

## 2018-12-28 VITALS — Ht 66.0 in | Wt 142.0 lb

## 2018-12-28 DIAGNOSIS — T63444D Toxic effect of venom of bees, undetermined, subsequent encounter: Secondary | ICD-10-CM

## 2018-12-28 DIAGNOSIS — Z7189 Other specified counseling: Secondary | ICD-10-CM

## 2018-12-28 DIAGNOSIS — Z1231 Encounter for screening mammogram for malignant neoplasm of breast: Secondary | ICD-10-CM

## 2018-12-28 DIAGNOSIS — Z0000 Encounter for general adult medical examination without abnormal findings: Secondary | ICD-10-CM

## 2018-12-28 DIAGNOSIS — M8589 Other specified disorders of bone density and structure, multiple sites: Secondary | ICD-10-CM | POA: Diagnosis not present

## 2018-12-28 MED ORDER — EPINEPHRINE 0.3 MG/0.3ML IJ SOAJ: 0.3000 mg | Freq: Once | INTRAMUSCULAR | 0 refills | Status: AC

## 2018-12-28 NOTE — Progress Notes (Signed)
VIDEO Encounter- SOAP NOTE Established Patient  This VIDEO encounter was conducted with the patient's (or proxy's) verbal consent via VIDEO telecommunications: yes/no: Yes Patient was instructed to have this encounter in a suitably private space; and to only have persons present to whom they give permission to participate. In addition, patient identity was confirmed by use of name plus two identifiers (DOB and address).  I discussed the limitations, risks, security and privacy concerns of performing an evaluation and management service by telephone and the availability of in person appointments. I also discussed with the patient that there may be a patient responsible charge related to this service. The patient expressed understanding and agreed to proceed.  I spent a total of TIME; 0 MIN TO 60 MIN: 30 minutes talking with the patient or their proxy.   QUICK REFERENCE INFORMATION: The ABCs of Providing the Annual Wellness Visit  CMS.gov Medicare Learning Network  [employer] Annual Wellness Visit  Subjective:   Kelsey Barron is a 68 y.o. Female who presents for an Annual Wellness Visit.  Patient Active Problem List   Diagnosis Date Noted  . Tubular adenoma 10/11/2018  . Diverticular disease of large intestine 10/11/2018    Past Medical History:  Diagnosis Date  . Cancer ([place])    Basal Skin Cell ( 2002)  . Cataract   . Fibroid   . Psoriasis   . Rib fracture   . Shingles times 3  . STD (sexually transmitted disease)    HSV1     Past Surgical History:  Procedure Laterality Date  . VAGINAL HYSTERECTOMY     ovaries retained, fibroids bleeding  . WISDOM TOOTH EXTRACTION       Outpatient Medications Prior to Visit  Medication Sig Dispense Refill  . Calcium Citrate-Vitamin D (CALCIUM + D PO) Take 600 mg by mouth.    . Estradiol (VAGIFEM) 10 MCG TABS vaginal tablet INSERT 1 TABLET VAGINALLY 2TIMES A WEEK 24 tablet 2  . Fluocinolone Acetonide Scalp (DERMA-SMOOTHE/FS SCALP) 0.01  % OIL Apply topically.    . Multiple Vitamins-Minerals (ICAPS) CAPS Take by mouth daily.    . valACYclovir (VALTREX) 500 MG tablet Take 500 mg by mouth 2 (two) times daily.    Marland Kitchen VITAMIN D PO Take by mouth.    . Wheat Dextrin (BENEFIBER ON THE GO) POWD Take by mouth.    . EPINEPHrine 0.3 mg/0.3 mL IJ SOAJ injection Inject 0.3 mLs (0.3 mg total) into the muscle once. 1 Device 1   No facility-administered medications prior to visit.     No Known Allergies   Family History  Problem Relation Age of Onset  . Cancer Mother        uterine & colon  . Diabetes Mother   . Macular degeneration Mother   . Dementia Mother   . Hepatitis Father   . Deep vein thrombosis Sister   . Diabetes Paternal Grandmother   . Stroke Maternal Grandmother   . Angina Maternal Grandfather   . Heart disease Paternal Grandfather        pacemaker  . Breast cancer Neg Hx      Social History   Socioeconomic History  . Marital status: Married    Spouse name: Not on file  . Number of children: Not on file  . Years of education: Not on file  . Highest education level: Not on file  Occupational History  . Not on file  Social Needs  . Financial resource strain: Not on file  . Food  insecurity    Worry: Not on file    Inability: Not on file  . Transportation needs    Medical: Not on file    Non-medical: Not on file  Tobacco Use  . Smoking status: Never Smoker  . Smokeless tobacco: Never Used  Substance and Sexual Activity  . Alcohol use: No  . Drug use: No  . Sexual activity: Yes    Partners: Male    Birth control/protection: Surgical    Comment: TVH  Lifestyle  . Physical activity    Days per week: Not on file    Minutes per session: Not on file  . Stress: Not on file  Relationships  . Social Herbalist on phone: Not on file    Gets together: Not on file    Attends religious service: Not on file    Active member of club or organization: Not on file    Attends meetings of clubs or  organizations: Not on file    Relationship status: Not on file  Other Topics Concern  . Not on file  Social History Narrative  . Not on file      Recent Hospitalizations? No  Health Habits: Current exercise activities include: walking Exercise: 3 times/week. Diet: in general, a "healthy" diet    Alcohol intake: none  Health Risk Assessment: The patient has completed a Health Risk Assessment. This has been reveiwed with them and has been scanned into the [facility] system as an attached document.  Current Medical Providers and Suppliers: [facility] Patient Care Team: Shawnee Knapp, MD as PCP - General (Family Medicine) Regina Eck, CNM as Referring Physician (Certified Nurse Midwife) Devra Dopp, MD as Referring Physician (Dermatology) Laurence Spates, MD as Consulting Physician (Gastroenterology) Future Appointments  Date Time Provider [place]  01/26/2019  1:00 PM Regina Eck, CNM [place] None    Age-appropriate Screening Schedule: The list below includes current immunization status and future screening recommendations based on patient's age. Orders for these recommended tests are listed in the plan section. The patient has been provided with a written plan. Immunization History  Administered Date(s) Administered  . Influenza, High Dose Seasonal PF 11/03/2018  . Influenza-Unspecified 12/01/2016, 11/26/2017, 11/03/2018  . Pneumococcal Conjugate-13 01/13/2018  . Tdap 11/22/2012  . Zoster 11/08/2013  . Zoster Recombinat (Shingrix) 02/11/2018, 04/27/2018    Health Maintenance reviewed -  patient asked to schedule her mammogram   Depression Screen-PHQ2/9 completed today  Depression screen [facility] 2/9 12/28/2018 04/26/2018 01/13/2018  Decreased Interest 0 0 0  Down, Depressed, Hopeless 0 0 0  PHQ - 2 Score 0 0 0     Depression Severity and Treatment Recommendations:  0-4= None  5-9= Mild / Treatment: Support, educate to call if worse; return in one  month  10-14= Moderate / Treatment: Support, watchful waiting; Antidepressant or Psycotherapy  15-19= Moderately severe / Treatment: Antidepressant OR Psychotherapy  >= 20 = Major depression, severe / Antidepressant AND Psychotherapy  Hearing Evaluation: 1. Do you have trouble hearing the television when others do not?   No 2. Do you have to strain to hear/understand conversations? No   Advanced Care Planning: 1. Patient has executed an Advance Directive: Yes 2. If no, patient was given the opportunity to execute an Advance Directive today? n/a 3. Are the patient's advanced directives in [place]? No 4. This patient has the ability to prepare an Advance Directive: Yes 5. Provider is willing to follow the patient's wishes: Yes  Cognitive  Assessment: Does the patient have evidence of cognitive impairment? No The patient does not have any evidence of any cognitive problems and denies any  change in mood/affect, appearance, speech, memory or motor skills.  Identification of Risk Factors:  ROS Review of Systems  Constitutional: Negative for activity change, appetite change, chills and fever.  HENT: Negative for congestion, nosebleeds, trouble swallowing and voice change.   Respiratory: Negative for cough, shortness of breath and wheezing.   Gastrointestinal: Negative for diarrhea, nausea and vomiting.  Genitourinary: Negative for difficulty urinating, dysuria, flank pain and hematuria.  Musculoskeletal: Negative for back pain, joint swelling and neck pain.  Neurological: Negative for dizziness, speech difficulty, light-headedness and numbness.  See HPI. All other review of systems negative.   Objective:   Vitals:   12/28/18 0815  Weight: 142 lb (64.4 kg)  Height: 5\' 6"  (1.676 m)   Wt Readings from Last 3 Encounters:  12/28/18 142 lb (64.4 kg)  04/26/18 168 lb 3.2 oz (76.3 kg)  01/21/18 182 lb (82.6 kg)     Body mass index is 22.92 kg/m.  Physical Exam Constitutional:       Appearance: Normal appearance. She is normal weight.  HENT:     Head: Normocephalic and atraumatic.  Eyes:     Extraocular Movements: Extraocular movements intact.     Conjunctiva/sclera: Conjunctivae normal.  Pulmonary:     Effort: Pulmonary effort is normal.     Breath sounds: No wheezing.  Neurological:     Mental Status: She is alert.  Psychiatric:        Mood and Affect: Mood normal.        Behavior: Behavior normal.        Thought Content: Thought content normal.        Judgment: Judgment normal.       Assessment/Plan:   Patient Self-Management and Personalized Health Advice The patient has been provided with information about:  continue current medications and continue current healthy lifestyle patterns  During the course of the visit the patient was educated and counseled about appropriate screening and preventive services including:   return annually or prn     Body mass index is 22.92 kg/m.  BMI improved with weight watchers  Diagnoses and all orders for this visit:  Medicare annual wellness visit, subsequent- discussed that she should continue weight watchers  Toxic effect of venom of bees, undetermined intent, subsequent encounter -     EPINEPHrine 0.3 mg/0.3 mL IJ SOAJ injection; Inject 0.3 mLs (0.3 mg total) into the muscle once for 1 dose. 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  Future Appointments  Date Time Provider [place]  01/26/2019  1:00 PM Regina Eck, CNM [place] None    There are no Patient Instructions on file for this visit.  An after visit summary with all of these plans was given to the patient.    I discussed the assessment and  treatment plan with the patient. The patient was provided an opportunity to ask questions and all were answered. The patient agreed with the plan and demonstrated an understanding of the instructions.  The patient was advised to call back or seek an in-person evaluation if  the symptoms worsen or if the condition fails to improve as anticipated.  I provided 30 minutes of face-to-face time during this encounter.  Forrest Moron, MD  Primary Care at [facility]

## 2018-12-28 NOTE — Progress Notes (Signed)
CC- Annual Wellness- Patient had labs done on 12/20/18 She stated she did not see there A1C resulted and I see that it have not been done it still in as a future lad. Also patient wanted her Vit D checked but the the labs stated they did not have an order for this lab and patient would like to have Vit D Checked

## 2018-12-29 ENCOUNTER — Encounter: Payer: Self-pay | Admitting: Family Medicine

## 2019-01-14 NOTE — Addendum Note (Signed)
Addended by: Delia Chimes A on: 01/14/2019 09:05 AM   Modules accepted: Level of Service

## 2019-01-21 ENCOUNTER — Other Ambulatory Visit: Payer: Self-pay

## 2019-01-21 ENCOUNTER — Ambulatory Visit (INDEPENDENT_AMBULATORY_CARE_PROVIDER_SITE_OTHER): Payer: Medicare Other | Admitting: Family Medicine

## 2019-01-21 DIAGNOSIS — Z23 Encounter for immunization: Secondary | ICD-10-CM | POA: Diagnosis not present

## 2019-01-24 ENCOUNTER — Other Ambulatory Visit: Payer: Self-pay

## 2019-01-26 ENCOUNTER — Other Ambulatory Visit: Payer: Self-pay

## 2019-01-26 ENCOUNTER — Other Ambulatory Visit (HOSPITAL_COMMUNITY)
Admission: RE | Admit: 2019-01-26 | Discharge: 2019-01-26 | Disposition: A | Discharge status: Home / Self Care | Payer: Medicare Other | Source: Ambulatory Visit | Attending: Certified Nurse Midwife | Admitting: Certified Nurse Midwife

## 2019-01-26 ENCOUNTER — Encounter: Payer: Self-pay | Admitting: Certified Nurse Midwife

## 2019-01-26 ENCOUNTER — Ambulatory Visit (INDEPENDENT_AMBULATORY_CARE_PROVIDER_SITE_OTHER): Payer: Medicare Other | Admitting: Certified Nurse Midwife

## 2019-01-26 VITALS — BP 110/74 | HR 68 | Temp 97.1°F | Resp 16 | Ht 65.75 in | Wt 142.0 lb

## 2019-01-26 DIAGNOSIS — Z01419 Encounter for gynecological examination (general) (routine) without abnormal findings: Secondary | ICD-10-CM

## 2019-01-26 DIAGNOSIS — B009 Herpesviral infection, unspecified: Secondary | ICD-10-CM

## 2019-01-26 DIAGNOSIS — N952 Postmenopausal atrophic vaginitis: Secondary | ICD-10-CM | POA: Diagnosis not present

## 2019-01-26 DIAGNOSIS — Z1151 Encounter for screening for human papillomavirus (HPV): Secondary | ICD-10-CM | POA: Insufficient documentation

## 2019-01-26 DIAGNOSIS — Z124 Encounter for screening for malignant neoplasm of cervix: Secondary | ICD-10-CM | POA: Insufficient documentation

## 2019-01-26 MED ORDER — ESTRADIOL 10 MCG VA TABS: ORAL_TABLET | VAGINAL | 3 refills | Status: DC

## 2019-01-26 MED ORDER — VALACYCLOVIR HCL 500 MG PO TABS: 500.0000 mg | ORAL_TABLET | Freq: Two times a day (BID) | ORAL | 12 refills | Status: AC

## 2019-01-26 NOTE — Patient Instructions (Signed)

## 2019-01-26 NOTE — Progress Notes (Signed)
68 y.o. [id number] Married  Caucasian Fe here for annual exam. Denies vaginal dryness. Has worked on weight loss and now have lost 40 pounds with weight watchers and exercise.Feels so much better. Sees PCP for aex, labs all normal. Using Vagifem twice weekly with good results, desires continuance. No HSV outbreaks, but needs Rx update. Has noted slight urinary urgency, but no frequency, back pain, fever or chills. Does not feel like UTI.  Sees PCP Dr. Nolon Rod for aex and labs. No other health concerns today.  Patient's last menstrual period was 03/11/1995.          Sexually active: Yes.    The current method of family planning is status post hysterectomy.    Exercising: Yes.    walking & stationary bike Smoker:  no  Review of Systems  Constitutional: Negative.   HENT: Negative.   Eyes: Negative.   Respiratory: Negative.   Cardiovascular: Negative.   Gastrointestinal: Negative.   Genitourinary: Negative.   Musculoskeletal: Negative.   Skin: Negative.   Neurological: Negative.   Endo/Heme/Allergies: Negative.   Psychiatric/Behavioral: Negative.     Health Maintenance: Pap:  2010 neg per patient History of Abnormal Pap: no MMG:  03-22-2018 category b density birads 1:neg Self Breast exams: occ Colonoscopy:  2020 f/u 8yrs due to family hx & 1 polyp BMD:   2019 normal, scheduled for next year. TDaP:  2014 Shingles: 2020 Pneumonia: 2020 Hep C and HIV: hep c neg 2017 Labs: with PCP   reports that she has never smoked. She has never used smokeless tobacco. She reports that she does not drink alcohol or use drugs.  Past Medical History:  Diagnosis Date  . Cancer ([place])    Basal Skin Cell ( 2002)  . Cataract   . Fibroid   . Psoriasis   . Rib fracture   . Shingles times 3  . STD (sexually transmitted disease)    HSV1    Past Surgical History:  Procedure Laterality Date  . VAGINAL HYSTERECTOMY     ovaries retained, fibroids bleeding  . WISDOM TOOTH EXTRACTION      Current  Outpatient Medications  Medication Sig Dispense Refill  . Calcium Citrate-Vitamin D (CALCIUM + D PO) Take 600 mg by mouth.    . Estradiol (VAGIFEM) 10 MCG TABS vaginal tablet INSERT 1 TABLET VAGINALLY 2TIMES A WEEK 24 tablet 2  . Fluocinolone Acetonide Scalp (DERMA-SMOOTHE/FS SCALP) 0.01 % OIL Apply topically.    . Multiple Vitamins-Minerals (ICAPS) CAPS Take by mouth daily.    Marland Kitchen VITAMIN D PO Take by mouth.    . Wheat Dextrin (BENEFIBER ON THE GO) POWD Take by mouth.    . EPINEPHrine 0.3 mg/0.3 mL IJ SOAJ injection     . valACYclovir (VALTREX) 500 MG tablet Take 500 mg by mouth 2 (two) times daily.     No current facility-administered medications for this visit.     Family History  Problem Relation Age of Onset  . Cancer Mother        uterine & colon  . Diabetes Mother   . Macular degeneration Mother   . Dementia Mother   . Hepatitis Father   . Deep vein thrombosis Sister   . Diabetes Paternal Grandmother   . Stroke Maternal Grandmother   . Angina Maternal Grandfather   . Heart disease Paternal Grandfather        pacemaker  . Breast cancer Neg Hx     ROS:  Pertinent items are noted in HPI.  Otherwise, a comprehensive ROS was negative.  Exam:   BP 110/74   Pulse 68   Temp (!) 97.1 F (36.2 C) (Skin)   Resp 16   Ht 5' 5.75" (1.67 m)   Wt 142 lb (64.4 kg)   LMP 03/11/1995   BMI 23.09 kg/m  Height: 5' 5.75" (167 cm) Ht Readings from Last 3 Encounters:  01/26/19 5' 5.75" (1.67 m)  12/28/18 5\' 6"  (1.676 m)  04/26/18 5\' 6"  (1.676 m)    General appearance: alert, cooperative and appears stated age Head: Normocephalic, without obvious abnormality, atraumatic Neck: no adenopathy, supple, symmetrical, trachea midline and thyroid normal to inspection and palpation Lungs: clear to auscultation bilaterally Breasts: normal appearance, no masses or tenderness, No nipple retraction or dimpling, No nipple discharge or bleeding, No axillary or supraclavicular adenopathy Heart:  regular rate and rhythm Abdomen: soft, non-tender; no masses,  no organomegaly Extremities: extremities normal, atraumatic, no cyanosis or edema Skin: Skin color, texture, turgor normal. No rashes or lesions Lymph nodes: Cervical, supraclavicular, and axillary nodes normal. No abnormal inguinal nodes palpated Neurologic: Grossly normal   Pelvic: External genitalia:  no lesions              Urethra:  normal appearing urethra with no masses, tenderness or lesions              Bartholin's and Skene's: normal                 Vagina: normal appearing vagina with normal color and discharge, no lesions, but area in posterior fornix increase pigmentation and some redness noted, non tender, pap taken of area              Cervix: absent              Pap taken: No. Bimanual Exam:  Uterus:  uterus absent              Adnexa: no mass, fullness, tenderness               Rectovaginal: Confirms               Anus:  normal sphincter tone, no lesions  Chaperone present: yes  A:  Well Woman with normal exam  Menopausal s/p TVH with vaginal dryness, Vagifem working well  Urinary urgency, urine negative  Pigmentation change vaginal, pap taken    P:   Reviewed health and wellness pertinent to exam  Discussed risks/benefits/warning signs with Vagifem use. Desires continuance.  Rx Vagifem see order with instructions.  Discussed feel urgency is from dryness around urethral meatus. Instructed to put pea size amount of Vagifem around meatus.  Discussed vaginal finding will assess with pap smear and advise. Suspect just atrophic change.  Pap smear: yes  counseled on breast self exam, mammography screening, feminine hygiene, menopause, osteoporosis, adequate intake of calcium and vitamin D, diet and exercise  return annually or prn  An After Visit Summary was printed and given to the patient.

## 2019-01-28 LAB — CYTOLOGY - PAP
Comment: NEGATIVE
Diagnosis: NEGATIVE
High risk HPV: NEGATIVE

## 2019-03-31 ENCOUNTER — Ambulatory Visit: Payer: Medicare Other | Attending: Internal Medicine

## 2019-03-31 DIAGNOSIS — Z23 Encounter for immunization: Secondary | ICD-10-CM | POA: Insufficient documentation

## 2019-03-31 NOTE — Progress Notes (Signed)
   Covid-19 Vaccination Clinic  Name:  Kelsey Barron    MRN: [id number] DOB: [birth year]  03/31/2019  [name] was observed post Covid-19 immunization for 15 minutes without incidence. She was provided with Vaccine Information Sheet and instruction to access the V-Safe system.   [name] was instructed to call 911 with any severe reactions post vaccine: Marland Kitchen Difficulty breathing  . Swelling of your face and throat  . A fast heartbeat  . A bad rash all over your body  . Dizziness and weakness    Immunizations Administered    Name Date Dose VIS Date Route   Pfizer COVID-19 Vaccine 03/31/2019  6:00 PM 0.3 mL 02/18/2019 Intramuscular   Manufacturer: [place]   Lot: [id number]   [place]: [id number]

## 2019-04-18 ENCOUNTER — Other Ambulatory Visit: Payer: Self-pay

## 2019-04-18 ENCOUNTER — Ambulatory Visit
Admission: RE | Admit: 2019-04-18 | Discharge: 2019-04-18 | Disposition: A | Discharge status: Home / Self Care | Payer: Medicare Other | Source: Ambulatory Visit | Attending: Family Medicine | Admitting: Family Medicine

## 2019-04-18 DIAGNOSIS — Z1231 Encounter for screening mammogram for malignant neoplasm of breast: Secondary | ICD-10-CM

## 2019-04-18 DIAGNOSIS — M8589 Other specified disorders of bone density and structure, multiple sites: Secondary | ICD-10-CM

## 2019-04-21 ENCOUNTER — Ambulatory Visit: Payer: Medicare Other | Attending: Internal Medicine

## 2019-04-21 DIAGNOSIS — Z23 Encounter for immunization: Secondary | ICD-10-CM | POA: Insufficient documentation

## 2019-04-21 NOTE — Progress Notes (Signed)
   Covid-19 Vaccination Clinic  Name:  MIANNA BOSMAN    MRN: [id number] DOB: 05/23/1950  04/21/2019  Ms. Restivo was observed post Covid-19 immunization for 15 minutes without incidence. She was provided with Vaccine Information Sheet and instruction to access the V-Safe system.   Ms. Gowell was instructed to call 911 with any severe reactions post vaccine: Marland Kitchen Difficulty breathing  . Swelling of your face and throat  . A fast heartbeat  . A bad rash all over your body  . Dizziness and weakness    Immunizations Administered    Name Date Dose VIS Date Route   Pfizer COVID-19 Vaccine 04/21/2019  5:27 PM 0.3 mL 02/18/2019 Intramuscular   Manufacturer: [place]   Lot: [id number]   [place]: [id number]

## 2019-05-31 ENCOUNTER — Encounter: Payer: Self-pay | Admitting: Certified Nurse Midwife

## 2019-11-06 IMAGING — MG DIGITAL SCREENING BILATERAL MAMMOGRAM WITH TOMO AND CAD
8 series · 8 of 24 positions shown · non-contrast
Comparison: Previous exam(s).

CLINICAL DATA: Screening.

EXAM:
DIGITAL SCREENING BILATERAL MAMMOGRAM WITH TOMO AND CAD

[R MLO synth-2D]
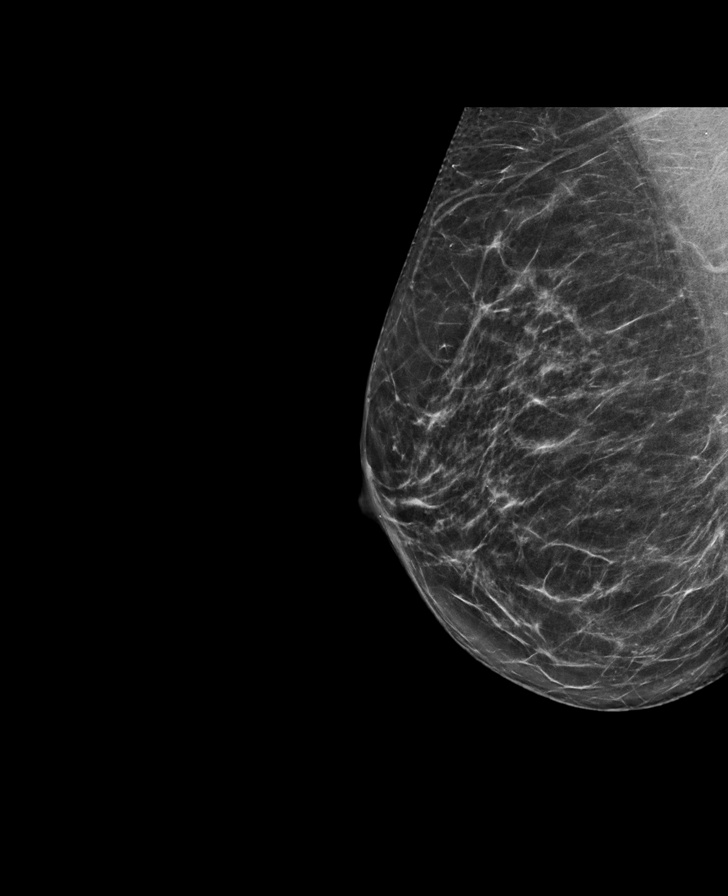

[L MLO synth-2D]
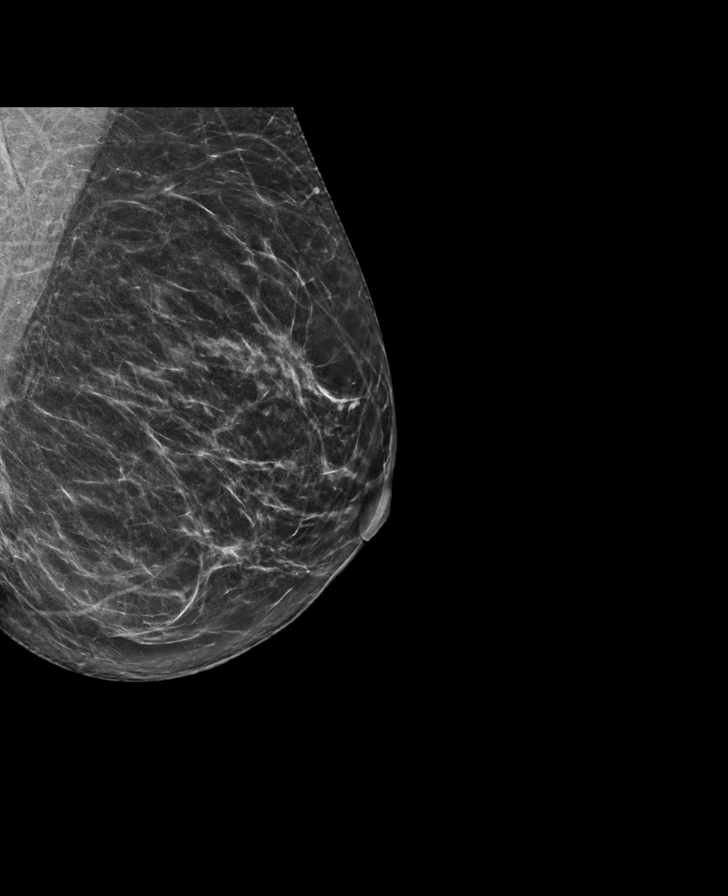

[R CC synth-2D]
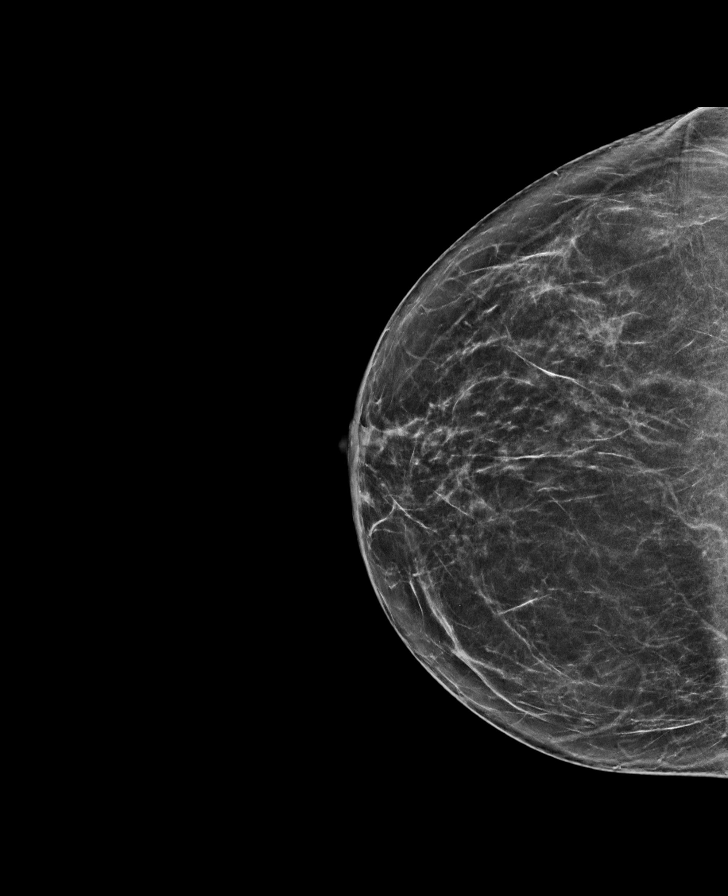

[L CC synth-2D]
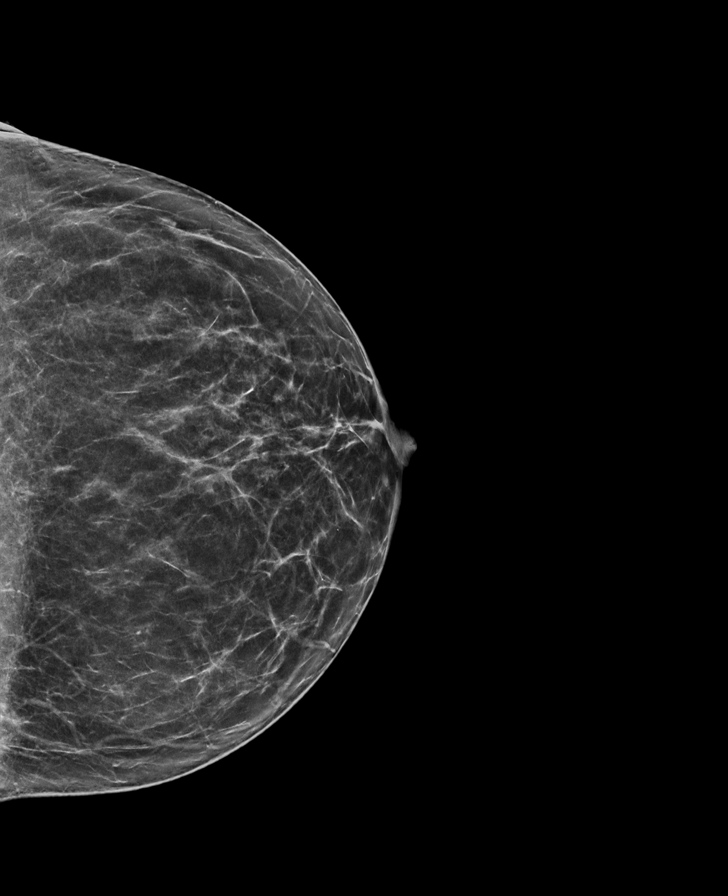

[R CC tomo · tomo slice 35/70.0]
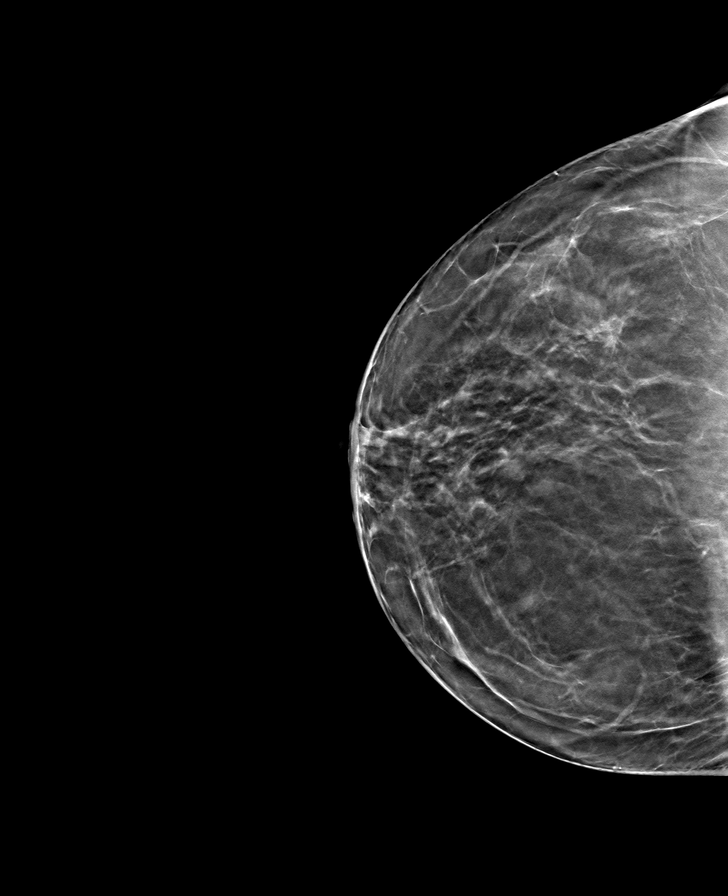

[L CC tomo · tomo slice 35/70.0]
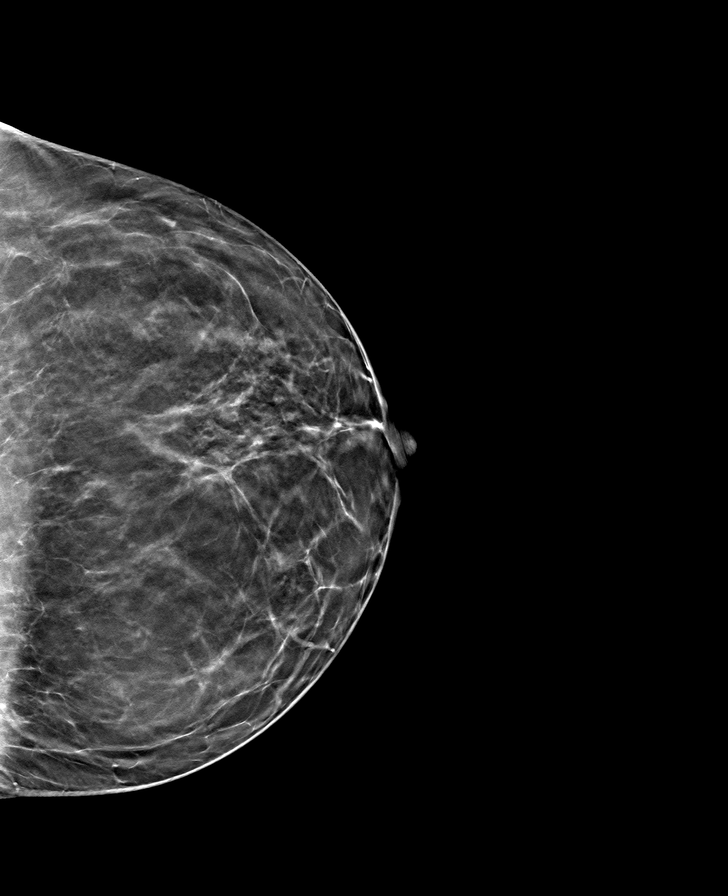

[L MLO tomo · tomo slice 37/72.0]
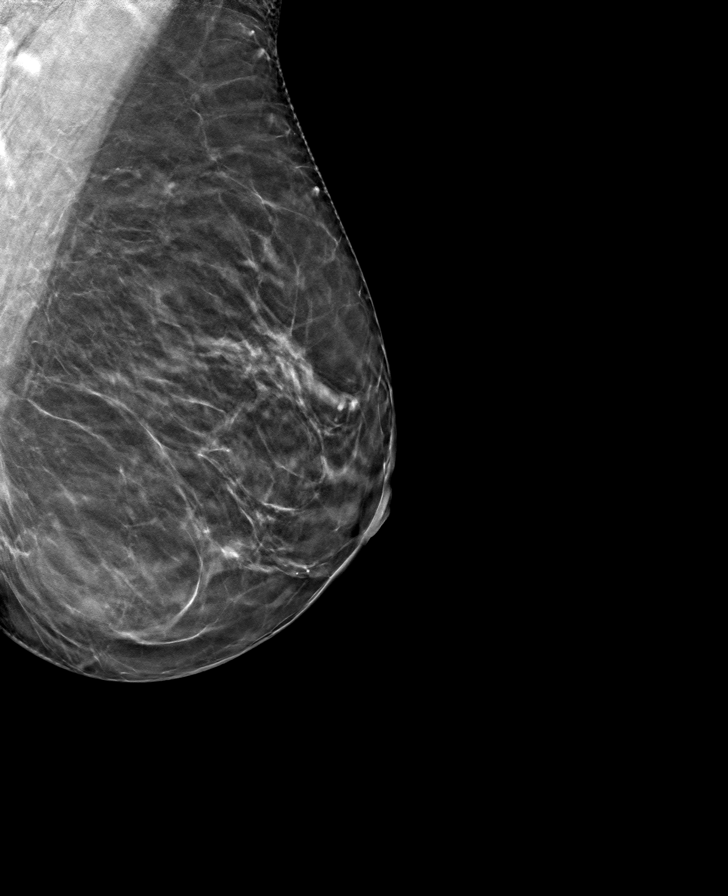

[R MLO tomo · tomo slice 36/71.0]
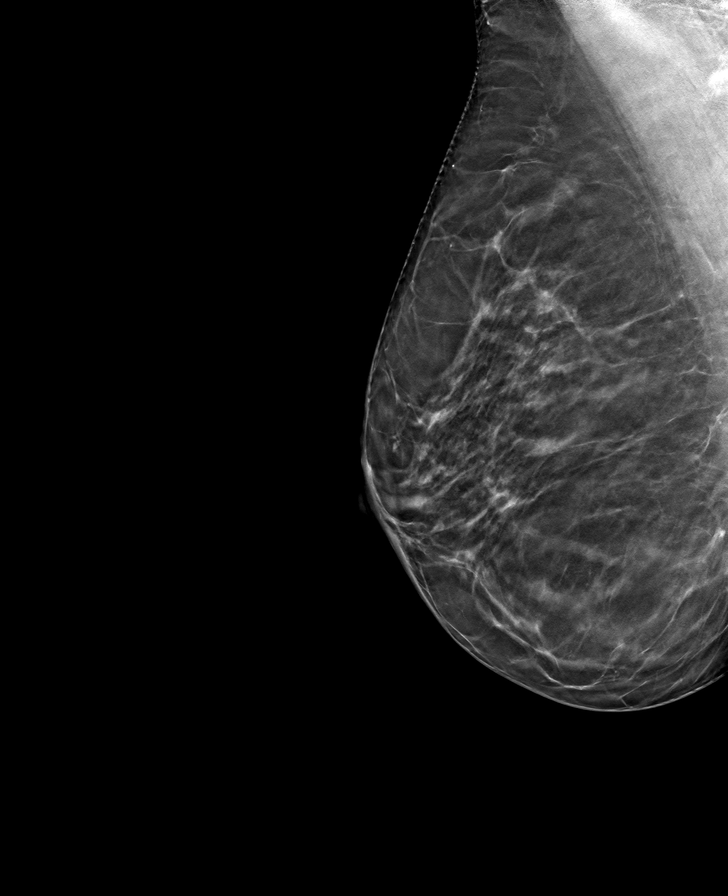

[8 of 24 positions shown; findings below may reference images not displayed]

ACR Breast Density Category b: There are scattered areas of
fibroglandular density.
FINDINGS: There are no findings suspicious for malignancy. Images were
processed with CAD.
IMPRESSION: No mammographic evidence of malignancy. A result letter of this
screening mammogram will be mailed directly to the patient.

RECOMMENDATION:
Screening mammogram in one year. (Code:[id number]-775)

BI-RADS CATEGORY  1: Negative.

## 2019-12-09 ENCOUNTER — Other Ambulatory Visit: Payer: Self-pay

## 2019-12-09 ENCOUNTER — Encounter: Payer: Self-pay | Admitting: Family Medicine

## 2019-12-09 ENCOUNTER — Ambulatory Visit (INDEPENDENT_AMBULATORY_CARE_PROVIDER_SITE_OTHER): Payer: Medicare Other | Admitting: Family Medicine

## 2019-12-09 VITALS — BP 90/70 | HR 87 | Temp 98.1°F | Ht 66.0 in | Wt 138.3 lb

## 2019-12-09 DIAGNOSIS — E559 Vitamin D deficiency, unspecified: Secondary | ICD-10-CM

## 2019-12-09 DIAGNOSIS — Z131 Encounter for screening for diabetes mellitus: Secondary | ICD-10-CM

## 2019-12-09 DIAGNOSIS — Z13220 Encounter for screening for lipoid disorders: Secondary | ICD-10-CM

## 2019-12-09 NOTE — Progress Notes (Signed)
Kelsey Barron DOB: October 05, 1950 Encounter date: 12/09/2019  This is a 69 y.o. female who presents to establish care. Chief Complaint  Patient presents with  . Establish Care    History of present illness: No specific concerns today.   Cataracts are monitored by Fonnie Mu.   Last bloodwork 12/2018.  Last colonoscopy: 09/2018 with repeat suggested 09/2023  Follows with [facility] Nan for gyn needs. Last pap normal/HPV negative from 01/2019.  Dr. Renda Rolls for derm needs. Hx of basal cell on face.  Mammogram (normal) and bone density (osteopenia) completed this year.  Walks daily. Does yoga.   Past Medical History:  Diagnosis Date  . Cancer ([place])    Basal Skin Cell ( 2002)  . Cataract   . Fibroid   . Psoriasis   . Rib fracture   . Shingles times 3  . STD (sexually transmitted disease)    HSV1   Past Surgical History:  Procedure Laterality Date  . VAGINAL HYSTERECTOMY     ovaries retained, fibroids bleeding  . WISDOM TOOTH EXTRACTION     Allergies  Allergen Reactions  . Bee Venom    Current Meds  Medication Sig  . Calcium Citrate-Vitamin D (CALCIUM + D PO) Take 600 mg by mouth.  . EPINEPHrine 0.3 mg/0.3 mL IJ SOAJ injection   . Estradiol (VAGIFEM) 10 MCG TABS vaginal tablet INSERT 1 TABLET VAGINALLY 2TIMES A WEEK  . Fluocinolone Acetonide Scalp (DERMA-SMOOTHE/FS SCALP) 0.01 % OIL Apply topically.  . Multiple Vitamins-Minerals (ICAPS) CAPS Take by mouth daily.  . valACYclovir (VALTREX) 500 MG tablet Take 1 tablet (500 mg total) by mouth 2 (two) times daily.  Marland Kitchen VITAMIN D PO Take 1,000 Units by mouth daily.   . Wheat Dextrin (BENEFIBER ON THE GO) POWD Take by mouth.   Social History   Tobacco Use  . Smoking status: Never Smoker  . Smokeless tobacco: Never Used  Substance Use Topics  . Alcohol use: No   Family History  Problem Relation Age of Onset  . Cancer Mother        uterine & colon  . Diabetes Mother   . Macular degeneration Mother   . Dementia Mother    . Hepatitis Father   . AAA (abdominal aortic aneurysm) Father   . Deep vein thrombosis Sister   . Diabetes Paternal Grandmother   . Stroke Maternal Grandmother   . Angina Maternal Grandfather   . Heart disease Paternal Grandfather        pacemaker  . Breast cancer Neg Hx      Review of Systems  Constitutional: Negative for chills, fatigue and fever.  Respiratory: Negative for cough, chest tightness, shortness of breath and wheezing.   Cardiovascular: Negative for chest pain, palpitations and leg swelling.    Objective:  BP 90/70 (BP Location: Left Arm, Patient Position: Sitting, Cuff Size: Normal)   Pulse 87   Temp 98.1 F (36.7 C) (Oral)   Ht 5\' 6"  (1.676 m)   Wt 138 lb 4.8 oz (62.7 kg)   LMP 03/11/1995   BMI 22.32 kg/m   Weight: 138 lb 4.8 oz (62.7 kg)   BP Readings from Last 3 Encounters:  12/09/19 90/70  01/26/19 110/74  04/26/18 110/76   Wt Readings from Last 3 Encounters:  12/09/19 138 lb 4.8 oz (62.7 kg)  01/26/19 142 lb (64.4 kg)  12/28/18 142 lb (64.4 kg)    Physical Exam Constitutional:      General: She is not in acute distress.  Appearance: She is well-developed.  Cardiovascular:     Rate and Rhythm: Normal rate and regular rhythm.     Heart sounds: Normal heart sounds. No murmur heard.  No friction rub.  Pulmonary:     Effort: Pulmonary effort is normal. No respiratory distress.     Breath sounds: Normal breath sounds. No wheezing or rales.  Musculoskeletal:     Right lower leg: No edema.     Left lower leg: No edema.  Neurological:     Mental Status: She is alert and oriented to person, place, and time.  Psychiatric:        Behavior: Behavior normal.     Assessment/Plan:  1. Lipid screening - Lipid panel; Future  2. Screening for diabetes mellitus - Comprehensive metabolic panel; Future  3. Vitamin D deficiency - VITAMIN D 25 Hydroxy (Vit-D Deficiency, Fractures); Future  Return in about 1 year (around 12/08/2020) for physical  exam.  Micheline Rough, MD

## 2019-12-13 ENCOUNTER — Other Ambulatory Visit (INDEPENDENT_AMBULATORY_CARE_PROVIDER_SITE_OTHER): Payer: Medicare Other

## 2019-12-13 ENCOUNTER — Other Ambulatory Visit: Payer: Self-pay

## 2019-12-13 DIAGNOSIS — Z131 Encounter for screening for diabetes mellitus: Secondary | ICD-10-CM

## 2019-12-13 DIAGNOSIS — E559 Vitamin D deficiency, unspecified: Secondary | ICD-10-CM

## 2019-12-13 DIAGNOSIS — Z13220 Encounter for screening for lipoid disorders: Secondary | ICD-10-CM

## 2019-12-14 LAB — COMPREHENSIVE METABOLIC PANEL
AG Ratio: 1.8 (calc) (ref 1.0–2.5)
ALT: 18 U/L (ref 6–29)
AST: 17 U/L (ref 10–35)
Albumin: 4.4 g/dL (ref 3.6–5.1)
Alkaline phosphatase (APISO): 49 U/L (ref 37–153)
BUN: 22 mg/dL (ref 7–25)
CO2: 31 mmol/L (ref 20–32)
Calcium: 9.5 mg/dL (ref 8.6–10.4)
Chloride: 103 mmol/L (ref 98–110)
Creat: 0.83 mg/dL (ref 0.50–0.99)
Globulin: 2.4 g/dL (calc) (ref 1.9–3.7)
Glucose, Bld: 91 mg/dL (ref 65–99)
Potassium: 4.7 mmol/L (ref 3.5–5.3)
Sodium: 139 mmol/L (ref 135–146)
Total Bilirubin: 0.7 mg/dL (ref 0.2–1.2)
Total Protein: 6.8 g/dL (ref 6.1–8.1)

## 2019-12-14 LAB — LIPID PANEL
Cholesterol: 183 mg/dL (ref <200)
HDL: 50 mg/dL (ref >=50)
LDL Cholesterol (Calc): 111 mg/dL (calc) — ABNORMAL HIGH
Non-HDL Cholesterol (Calc): 133 mg/dL (calc) — ABNORMAL HIGH (ref <130)
Total CHOL/HDL Ratio: 3.7 (calc) (ref <5.0)
Triglycerides: 117 mg/dL (ref <150)

## 2019-12-14 LAB — VITAMIN D 25 HYDROXY (VIT D DEFICIENCY, FRACTURES): Vit D, 25-Hydroxy: 37 ng/mL (ref 30–100)

## 2020-01-02 ENCOUNTER — Encounter: Payer: Medicare Other | Admitting: Family Medicine

## 2020-01-27 ENCOUNTER — Ambulatory Visit: Payer: Medicare Other | Admitting: Certified Nurse Midwife

## 2020-02-06 ENCOUNTER — Other Ambulatory Visit: Payer: Self-pay

## 2020-02-06 ENCOUNTER — Encounter: Payer: Self-pay | Admitting: Obstetrics and Gynecology

## 2020-02-06 ENCOUNTER — Ambulatory Visit (INDEPENDENT_AMBULATORY_CARE_PROVIDER_SITE_OTHER): Payer: Medicare Other | Admitting: Obstetrics and Gynecology

## 2020-02-06 VITALS — BP 108/64 | HR 109 | Ht 65.75 in | Wt 135.6 lb

## 2020-02-06 DIAGNOSIS — N952 Postmenopausal atrophic vaginitis: Secondary | ICD-10-CM | POA: Diagnosis not present

## 2020-02-06 DIAGNOSIS — Z01419 Encounter for gynecological examination (general) (routine) without abnormal findings: Secondary | ICD-10-CM

## 2020-02-06 DIAGNOSIS — Z8739 Personal history of other diseases of the musculoskeletal system and connective tissue: Secondary | ICD-10-CM | POA: Insufficient documentation

## 2020-02-06 DIAGNOSIS — Z8049 Family history of malignant neoplasm of other genital organs: Secondary | ICD-10-CM | POA: Insufficient documentation

## 2020-02-06 DIAGNOSIS — Z800 Family history of malignant neoplasm of digestive organs: Secondary | ICD-10-CM | POA: Diagnosis not present

## 2020-02-06 DIAGNOSIS — M7918 Myalgia, other site: Secondary | ICD-10-CM

## 2020-02-06 MED ORDER — ESTRADIOL 10 MCG VA TABS: ORAL_TABLET | VAGINAL | 3 refills | Status: DC

## 2020-02-06 NOTE — Patient Instructions (Addendum)
With an oral herpes outbreak, take 2 grams of valtrex every 12 hours for 2 doses.   You can try replens vaginal moisturizer.   EXERCISE   We recommended that you start or continue a regular exercise program for good health. Physical activity is anything that gets your body moving, some is better than none. The CDC recommends 150 minutes per week of Moderate-Intensity Aerobic Activity and 2 or more days of Muscle Strengthening Activity.  Benefits of exercise are limitless: helps weight loss/weight maintenance, improves mood and energy, helps with depression and anxiety, improves sleep, tones and strengthens muscles, improves balance, improves bone density, protects from chronic conditions such as heart disease, high blood pressure and diabetes and so much more. To learn more visit: WhyNotPoker.uy  DIET: Good nutrition starts with a healthy diet of fruits, vegetables, whole grains, and lean protein sources. Drink plenty of water for hydration. Minimize empty calories, sodium, sweets. For more information about dietary recommendations visit: GeekRegister.com.ee and http://schaefer-mitchell.com/  ALCOHOL:  Women should limit their alcohol intake to no more than 7 drinks/beers/glasses of wine (combined, not each!) per week. Moderation of alcohol intake to this level decreases your risk of breast cancer and liver damage.  If you are concerned that you may have a problem, or your friends have told you they are concerned about your drinking, there are many resources to help. A well-known program that is free, effective, and available to all people all over the nation is Alcoholics Anonymous.  Check out this site to learn more: BlockTaxes.se   CALCIUM AND VITAMIN D:  Adequate intake of calcium and Vitamin D are recommended for bone health.  The recommendations for exact amounts of these supplements seem to change often, but  generally speaking 1000-1500 mg of calcium (between diet and supplement) and 800 units of Vitamin D per day seems prudent.     PAP SMEARS:  Pap smears, to check for cervical cancer or precancers,  have traditionally been done yearly, although recent scientific advances have shown that most women can have pap smears less often.  However, every woman still should have a physical exam from her gynecologist every year. It will include a breast check, inspection of the vulva and vagina to check for abnormal growths or skin changes, a visual exam of the cervix, and then an exam to evaluate the size and shape of the uterus and ovaries.  And after 67 years of age, a rectal exam is indicated to check for rectal cancers. We will also provide age appropriate advice regarding health maintenance, like when you should have certain vaccines, screening for sexually transmitted diseases, bone density testing, colonoscopy, mammograms, etc.   MAMMOGRAMS:  All women over 63 years old should have a routine mammogram.   COLON CANCER SCREENING: Now recommend starting at age 90. At this time colonoscopy is not covered for routine screening until 50. There are take home tests that can be done between 45-49.   COLONOSCOPY:  Colonoscopy to screen for colon cancer is recommended for all women at age 41.  We know, you hate the idea of the prep.  We agree, BUT, having colon cancer and not knowing it is worse!!  Colon cancer so often starts as a polyp that can be seen and removed at colonscopy, which can quite literally save your life!  And if your first colonoscopy is normal and you have no family history of colon cancer, most women don't have to have it again for 10 years.  Once every  ten years, you can do something that may end up saving your life, right?  We will be happy to help you get it scheduled when you are ready.  Be sure to check your insurance coverage so you understand how much it will cost.  It may be covered as a  preventative service at no cost, but you should check your particular policy.      Breast Self-Awareness Breast self-awareness means being familiar with how your breasts look and feel. It involves checking your breasts regularly and reporting any changes to your health care provider. Practicing breast self-awareness is important. A change in your breasts can be a sign of a serious medical problem. Being familiar with how your breasts look and feel allows you to find any problems early, when treatment is more likely to be successful. All women should practice breast self-awareness, including women who have had breast implants. How to do a breast self-exam One way to learn what is normal for your breasts and whether your breasts are changing is to do a breast self-exam. To do a breast self-exam: Look for Changes  1. Remove all the clothing above your waist. 2. Stand in front of a mirror in a room with good lighting. 3. Put your hands on your hips. 4. Push your hands firmly downward. 5. Compare your breasts in the mirror. Look for differences between them (asymmetry), such as: ? Differences in shape. ? Differences in size. ? Puckers, dips, and bumps in one breast and not the other. 6. Look at each breast for changes in your skin, such as: ? Redness. ? Scaly areas. 7. Look for changes in your nipples, such as: ? Discharge. ? Bleeding. ? Dimpling. ? Redness. ? A change in position. Feel for Changes Carefully feel your breasts for lumps and changes. It is best to do this while lying on your back on the floor and again while sitting or standing in the shower or tub with soapy water on your skin. Feel each breast in the following way:  Place the arm on the side of the breast you are examining above your head.  Feel your breast with the other hand.  Start in the nipple area and make  inch (2 cm) overlapping circles to feel your breast. Use the pads of your three middle fingers to do this.  Apply light pressure, then medium pressure, then firm pressure. The light pressure will allow you to feel the tissue closest to the skin. The medium pressure will allow you to feel the tissue that is a little deeper. The firm pressure will allow you to feel the tissue close to the ribs.  Continue the overlapping circles, moving downward over the breast until you feel your ribs below your breast.  Move one finger-width toward the center of the body. Continue to use the  inch (2 cm) overlapping circles to feel your breast as you move slowly up toward your collarbone.  Continue the up and down exam using all three pressures until you reach your armpit.  Write Down What You Find  Write down what is normal for each breast and any changes that you find. Keep a written record with breast changes or normal findings for each breast. By writing this information down, you do not need to depend only on memory for size, tenderness, or location. Write down where you are in your menstrual cycle, if you are still menstruating. If you are having trouble noticing differences in your breasts, do not  get discouraged. With time you will become more familiar with the variations in your breasts and more comfortable with the exam. How often should I examine my breasts? Examine your breasts every month. If you are breastfeeding, the best time to examine your breasts is after a feeding or after using a breast pump. If you menstruate, the best time to examine your breasts is 5-7 days after your period is over. During your period, your breasts are lumpier, and it may be more difficult to notice changes. When should I see my health care provider? See your health care provider if you notice:  A change in shape or size of your breasts or nipples.  A change in the skin of your breast or nipples, such as a reddened or scaly area.  Unusual discharge from your nipples.  A lump or thick area that was not there before.  Pain  in your breasts.  Anything that concerns you.   Musculoskeletal Pain Musculoskeletal pain refers to aches and pains in your bones, joints, muscles, and the tissues that surround them. This pain can occur in any part of the body. It can last for a short time (acute) or a long time (chronic). A physical exam, lab tests, and imaging studies may be done to find the cause of your musculoskeletal pain. Follow these instructions at home:  Lifestyle  Try to control or lower your stress levels. Stress increases muscle tension and can worsen musculoskeletal pain. It is important to recognize when you are anxious or stressed and learn ways to manage it. This may include: ? Meditation or yoga. ? Cognitive or behavioral therapy. ? Acupuncture or massage therapy.  You may continue all activities unless the activities cause more pain. When the pain gets better, slowly resume your normal activities. Gradually increase the intensity and duration of your activities or exercise. Managing pain, stiffness, and swelling  Take over-the-counter and prescription medicines only as told by your health care provider.  When your pain is severe, bed rest may be helpful. Lie or sit in any position that is comfortable, but get out of bed and walk around at least every couple of hours.  If directed, apply heat to the affected area as often as told by your health care provider. Use the heat source that your health care provider recommends, such as a moist heat pack or a heating pad. ? Place a towel between your skin and the heat source. ? Leave the heat on for 20-30 minutes. ? Remove the heat if your skin turns bright red. This is especially important if you are unable to feel pain, heat, or cold. You may have a greater risk of getting burned.  If directed, put ice on the painful area. ? Put ice in a plastic bag. ? Place a towel between your skin and the bag. ? Leave the ice on for 20 minutes, 2-3 times a  day. General instructions  Your health care provider may recommend that you see a physical therapist. This person can help you come up with a safe exercise program. Do any exercises as told by your physical therapist.  Keep all follow-up visits, including any physical therapy visits, as told by your health care providers. This is important. Contact a health care provider if:  Your pain gets worse.  Medicines do not help ease your pain.  You cannot use the part of your body that hurts, such as your arm, leg, or neck.  You have trouble sleeping.  You  have trouble doing your normal activities. Get help right away if:  You have a new injury and your pain is worse or different.  You feel numb or you have tingling in the painful area. Summary  Musculoskeletal pain refers to aches and pains in your bones, joints, muscles, and the tissues that surround them.  This pain can occur in any part of the body.  Your health care provider may recommend that you see a physical therapist. This person can help you come up with a safe exercise program. Do any exercises as told by your physical therapist.  Lower your stress level. Stress can worsen musculoskeletal pain. Ways to lower stress may include meditation, yoga, cognitive or behavioral therapy, acupuncture, and massage therapy. This information is not intended to replace advice given to you by your health care provider. Make sure you discuss any questions you have with your health care provider. Document Revised: 02/06/2017 Document Reviewed: 03/26/2016 Elsevier Patient Education  2020 [employer].

## 2020-02-06 NOTE — Progress Notes (Signed)
69 y.o. G24P2002 Married White or Caucasian Not Hispanic or Latino female here for annual exam.  H/O hysterectomy. Patient states that she has some vaginal dryness and a slight pain at time near her left armpit on her chest wall.  She has a 1 year h/o intermittent discomfort on her chest wall or shoulder blade on the right. Doesn't hurt to move, doesn't hurt to touch. No lumps. The pain is random, lasts for 15 seconds, dull and subtle. 1/10 in severity. She can go weeks without noticing the discomfort, not worsening with time. She has had a normal mammogram since the discomfort started.   She uses vaginal estrogen 2 x a week, helps with vaginal dryness. Not currently sexually active. She does use coconut oil externally.   H/O oral HSV, prn use of valtrex. One outbreak in the last year.     Patient's last menstrual period was 03/11/1995.          Sexually active: No.  The current method of family planning is post menopausal status.    Exercising: Yes.    walking  Smoker:  no  Health Maintenance: Pap:  01/26/19 WNL Hr HPV Neg of vaginal cuff, done secondary to pigment change at the apex.  History of abnormal Pap:  no MMG:  04/19/19 density b Bi-rads 1 neg  BMD:   04/18/19 osteopenic, FRAX 9.8/1.5  Colonoscopy: 09/14/18 follow up 5 years  TDaP:  11/22/12  Gardasil: na    reports that she has never smoked. She has never used smokeless tobacco. She reports that she does not drink alcohol and does not use drugs. Retired. 2 kids, 2 granddaughters. No one is local.   Past Medical History:  Diagnosis Date  . Cancer ([place])    Basal Skin Cell ( 2002)  . Cataract   . Fibroid   . Psoriasis   . Rib fracture   . Shingles times 3  . STD (sexually transmitted disease)    HSV1    Past Surgical History:  Procedure Laterality Date  . VAGINAL HYSTERECTOMY     ovaries retained, fibroids bleeding  . WISDOM TOOTH EXTRACTION      Current Outpatient Medications  Medication Sig Dispense Refill  . Calcium  Citrate-Vitamin D (CALCIUM + D PO) Take 600 mg by mouth.    . EPINEPHrine 0.3 mg/0.3 mL IJ SOAJ injection     . Estradiol (VAGIFEM) 10 MCG TABS vaginal tablet INSERT 1 TABLET VAGINALLY 2TIMES A WEEK 24 tablet 3  . Fluocinolone Acetonide Scalp (DERMA-SMOOTHE/FS SCALP) 0.01 % OIL Apply topically.    . Multiple Vitamins-Minerals (ICAPS) CAPS Take by mouth daily.    . valACYclovir (VALTREX) 500 MG tablet Take 1 tablet (500 mg total) by mouth 2 (two) times daily. 30 tablet 12  . VITAMIN D PO Take 1,000 Units by mouth daily.     . Wheat Dextrin (BENEFIBER ON THE GO) POWD Take by mouth.     No current facility-administered medications for this visit.    Family History  Problem Relation Age of Onset  . Cancer Mother        uterine & colon  . Diabetes Mother   . Macular degeneration Mother   . Dementia Mother   . Hepatitis Father   . AAA (abdominal aortic aneurysm) Father   . Deep vein thrombosis Sister   . Diabetes Paternal Grandmother   . Stroke Maternal Grandmother   . Angina Maternal Grandfather   . Heart disease Paternal Grandfather  pacemaker  . Breast cancer Neg Hx     Review of Systems  All other systems reviewed and are negative.   Exam:   BP 108/64 (BP Location: Left Arm, Patient Position: Sitting, Cuff Size: Normal)   Pulse (!) 109   Ht 5' 5.75" (1.67 m)   Wt 135 lb 9.6 oz (61.5 kg)   LMP 03/11/1995   SpO2 96%   BMI 22.05 kg/m   Weight change: @WEIGHTCHANGE @ Height:   Height: 5' 5.75" (167 cm)  Ht Readings from Last 3 Encounters:  02/06/20 5' 5.75" (1.67 m)  12/09/19 5\' 6"  (1.676 m)  01/26/19 5' 5.75" (1.67 m)    General appearance: alert, cooperative and appears stated age Head: Normocephalic, without obvious abnormality, atraumatic Neck: no adenopathy, supple, symmetrical, trachea midline and thyroid normal to inspection and palpation Lungs: clear to auscultation bilaterally Cardiovascular: regular rate and rhythm Breasts: normal appearance, no masses  or tenderness Abdomen: soft, non-tender; non distended,  no masses,  no organomegaly Extremities: extremities normal, atraumatic, no cyanosis or edema Skin: Skin color, texture, turgor normal. No rashes or lesions Lymph nodes: Cervical, supraclavicular, and axillary nodes normal. No abnormal inguinal nodes palpated Neurologic: Grossly normal   Pelvic: External genitalia:  no lesions              Urethra:  normal appearing urethra with no masses, tenderness or lesions              Bartholins and Skenes: normal                 Vagina: atrophic appearing vagina with more marked appearing atrophy/redness on either vaginal side wall. No lesions, plaques or ulcerations.               Cervix: absent               Bimanual Exam:  Uterus:  uterus absent              Adnexa: no mass, fullness, tenderness               Rectovaginal: Confirms               Anus:  normal sphincter tone, no lesions  Gae Dry chaperoned for the exam.  A:  Well Woman with normal exam  H/O TVH  Vaginal atrophy  H/O rare oral HSV  FH uterine and colon cancer  Chest wall/shoulder discomfort. No breast lumps or axillary lymphadenopathy. Not tender with palpation of the chest wall    P:   No pap needed   Continue with vaginal estrogen, discussed changing to the cream (declines for now)  Discussed vulvar skin care, information given  Mammogram in 2/22  DEXA in 2/23  Colonoscopy UTD, does every 5 years  Discussed breast self exam  Discussed calcium and vit D intake   Discussed dosing of Valtrex for oral hsv, she will call if she needs a refill.   She will try and keep better track of her shoulder/chest wall pain and f/u with her primary

## 2020-02-20 ENCOUNTER — Other Ambulatory Visit: Payer: Self-pay | Admitting: Obstetrics and Gynecology

## 2020-02-20 DIAGNOSIS — Z1231 Encounter for screening mammogram for malignant neoplasm of breast: Secondary | ICD-10-CM

## 2020-02-28 ENCOUNTER — Other Ambulatory Visit: Payer: Self-pay

## 2020-02-28 ENCOUNTER — Ambulatory Visit (INDEPENDENT_AMBULATORY_CARE_PROVIDER_SITE_OTHER): Payer: Medicare Other

## 2020-02-28 VITALS — BP 100/62 | HR 63 | Temp 98.4°F | Resp 20 | Wt 138.5 lb

## 2020-02-28 DIAGNOSIS — Z0000 Encounter for general adult medical examination without abnormal findings: Secondary | ICD-10-CM | POA: Diagnosis not present

## 2020-02-28 NOTE — Progress Notes (Signed)
Subjective:   Kelsey Barron is a 69 y.o. female who presents for Medicare Annual (Subsequent) preventive examination.  Review of Systems     Cardiac Risk Factors include: advanced age (>24men, >69 women)     Objective:    Today's Vitals   02/28/20 1350  BP: 100/62  Pulse: 63  Resp: 20  Temp: 98.4 F (36.9 C)  SpO2: 97%  Weight: 138 lb 8 oz (62.8 kg)   Body mass index is 22.52 kg/m.  Advanced Directives 02/28/2020 04/26/2018  Does Patient Have a Medical Advance Directive? Yes Yes  Type of Paramedic of [place];Living will Out of facility DNR (pink MOST or yellow form);[place];Living will  Does patient want to make changes to medical advance directive? - No - Patient declined  Copy of [place] in Chart? No - copy requested No - copy requested    Current Medications (verified) Outpatient Encounter Medications as of 02/28/2020  Medication Sig  . Calcium Citrate-Vitamin D (CALCIUM + D PO) Take 600 mg by mouth.  . EPINEPHrine 0.3 mg/0.3 mL IJ SOAJ injection   . Estradiol (VAGIFEM) 10 MCG TABS vaginal tablet INSERT 1 TABLET VAGINALLY 2TIMES A WEEK  . Fluocinolone Acetonide Scalp 0.01 % OIL Apply topically.  . Multiple Vitamins-Minerals (ICAPS) CAPS Take by mouth daily.  . valACYclovir (VALTREX) 500 MG tablet Take 1 tablet (500 mg total) by mouth 2 (two) times daily.  Marland Kitchen VITAMIN D PO Take 1,000 Units by mouth daily.   . Wheat Dextrin (BENEFIBER ON THE GO) POWD Take by mouth.   No facility-administered encounter medications on file as of 02/28/2020.    Allergies (verified) Bee venom   History: Past Medical History:  Diagnosis Date  . Cancer ([place])    Basal Skin Cell ( 2002)  . Cataract   . Fibroid   . Psoriasis   . Rib fracture   . Shingles times 3  . STD (sexually transmitted disease)    HSV1   Past Surgical History:  Procedure Laterality Date  . VAGINAL HYSTERECTOMY     ovaries retained,  fibroids bleeding  . WISDOM TOOTH EXTRACTION     Family History  Problem Relation Age of Onset  . Cancer Mother        uterine & colon  . Diabetes Mother   . Macular degeneration Mother   . Dementia Mother   . Hepatitis Father   . AAA (abdominal aortic aneurysm) Father   . Deep vein thrombosis Sister   . Diabetes Paternal Grandmother   . Stroke Maternal Grandmother   . Angina Maternal Grandfather   . Heart disease Paternal Grandfather        pacemaker  . Breast cancer Neg Hx    Social History   Socioeconomic History  . Marital status: Married    Spouse name: Not on file  . Number of children: Not on file  . Years of education: Not on file  . Highest education level: Not on file  Occupational History  . Occupation: retired  Tobacco Use  . Smoking status: Never Smoker  . Smokeless tobacco: Never Used  Substance and Sexual Activity  . Alcohol use: No  . Drug use: No  . Sexual activity: Yes    Partners: Male    Birth control/protection: Surgical    Comment: [facility]  Other Topics Concern  . Not on file  Social History Narrative  . Not on file   Social Determinants of Health  Financial Resource Strain: Low Risk   . Difficulty of Paying Living Expenses: Not hard at all  Food Insecurity: No Food Insecurity  . Worried About Charity fundraiser in the Last Year: Never true  . Ran Out of Food in the Last Year: Never true  Transportation Needs: No Transportation Needs  . Lack of Transportation (Medical): No  . Lack of Transportation (Non-Medical): No  Physical Activity: Sufficiently Active  . Days of Exercise per Week: 7 days  . Minutes of Exercise per Session: 30 min  Stress: No Stress Concern Present  . Feeling of Stress : Only a little  Social Connections: Moderately Integrated  . Frequency of Communication with Friends and Family: More than three times a week  . Frequency of Social Gatherings with Friends and Family: More than three times a week  . Attends  Religious Services: More than 4 times per year  . Active Member of Clubs or Organizations: No  . Attends Archivist Meetings: Never  . Marital Status: Married    Tobacco Counseling Counseling given: Not Answered   Clinical Intake:  Pre-visit preparation completed: Yes  Pain : No/denies pain     BMI - recorded: 22.52 Nutritional Status: BMI of 19-24  Normal Nutritional Risks: None Diabetes: No  How often do you need to have someone help you when you read instructions, pamphlets, or other written materials from your doctor or pharmacy?: 1 - Never  Diabetic? No  Interpreter Needed?: No  Information entered by :: Charlott Rakes, LPN   Activities of Daily Living In your present state of health, do you have any difficulty performing the following activities: 02/28/2020  Hearing? Y  Comment slight  Vision? N  Difficulty concentrating or making decisions? N  Walking or climbing stairs? N  Dressing or bathing? N  Doing errands, shopping? N  Preparing Food and eating ? N  Using the Toilet? N  In the past six months, have you accidently leaked urine? N  Do you have problems with loss of bowel control? N  Managing your Medications? N  Managing your Finances? N  Housekeeping or managing your Housekeeping? N  Some recent data might be hidden    Patient Care Team: Caren Macadam, MD as PCP - General (Family Medicine) Regina Eck, CNM as Referring Physician (Certified Nurse Midwife) Devra Dopp, MD as Referring Physician (Dermatology) Laurence Spates, MD (Inactive) as Consulting Physician (Gastroenterology)  Indicate any recent Medical Services you may have received from other than [facility] providers in the past year (date may be approximate).     Assessment:   This is a routine wellness examination for Kelsey Barron.  Hearing/Vision screen  Hearing Screening   125Hz  250Hz  500Hz  1000Hz  2000Hz  3000Hz  4000Hz  6000Hz  8000Hz   Right ear:            Left ear:           Comments: Slight loss  Vision Screening Comments: Pt follows [place] [facility] for annual exams  Dietary issues and exercise activities discussed: Current Exercise Habits: Home exercise routine;Structured exercise class, Type of exercise: walking;treadmill, Time (Minutes): 30, Frequency (Times/Week): 7, Weekly Exercise (Minutes/Week): 210  Goals    . DIET - REDUCE FAT INTAKE     Would like to reach 145 pounds    . Patient Stated     Maintain weight and keep up with exercise      Depression Screen [facility] 2/9 Scores 02/28/2020 12/28/2018 04/26/2018 01/13/2018  PHQ - 2 Score  0 0 0 0    Fall Risk Fall Risk  02/28/2020 12/28/2018 04/26/2018 01/13/2018  Falls in the past year? 0 0 0 0  Number falls in past yr: 0 0 0 0  Injury with Fall? 0 0 0 0  Risk for fall due to : Impaired vision - - -  Follow up Falls prevention discussed - Falls evaluation completed;Falls prevention discussed -    FALL RISK PREVENTION PERTAINING TO THE HOME:  Any stairs in or around the home? Yes  If so, are there any without handrails? No  Home free of loose throw rugs in walkways, pet beds, electrical cords, etc? Yes  Adequate lighting in your home to reduce risk of falls? Yes   ASSISTIVE DEVICES UTILIZED TO PREVENT FALLS:  Life alert? No  Use of a cane, walker or w/c? No  Grab bars in the bathroom? Yes  Shower chair or bench in shower? Yes  Elevated toilet seat or a handicapped toilet? No   TIMED UP AND GO:  Was the test performed? Yes .  Length of time to ambulate 10 feet: 10 sec.   Gait steady and fast without use of assistive device  Cognitive Function:     6CIT Screen 04/26/2018  What Year? 0 points  What month? 0 points  What time? 0 points  Count back from 20 0 points  Months in reverse 0 points  Repeat phrase 0 points  Total Score 0    Immunizations Immunization History  Administered Date(s) Administered  . Influenza, High Dose Seasonal PF 11/03/2018  .  Influenza-Unspecified 12/01/2016, 11/26/2017, 11/03/2018, 11/08/2019  . PFIZER SARS-COV-2 Vaccination 03/31/2019, 04/21/2019, 12/05/2019  . Pneumococcal Conjugate-13 01/13/2018  . Pneumococcal Polysaccharide-23 01/21/2019  . Tdap 11/22/2012  . Zoster 11/08/2013  . Zoster Recombinat (Shingrix) 02/11/2018, 04/27/2018    TDAP status: Up to date  Flu Vaccine status: Up to date  Pneumococcal vaccine status: Up to date  Covid-19 vaccine status: Completed vaccines  Qualifies for Shingles Vaccine? Yes   Zostavax completed Yes   Shingrix Completed?: Yes  Screening Tests Health Maintenance  Topic Date Due  . MAMMOGRAM  04/17/2021  . TETANUS/TDAP  11/23/2022  . COLONOSCOPY  09/14/2023  . INFLUENZA VACCINE  Completed  . DEXA SCAN  Completed  . COVID-19 Vaccine  Completed  . Hepatitis C Screening  Completed  . PNA vac Low Risk Adult  Completed    Health Maintenance  There are no preventive care reminders to display for this patient.  Colorectal cancer screening: Type of screening: Colonoscopy. Completed 09/14/18. Repeat every 5 years  Mammogram status: Completed 04/18/19. Repeat every year  Bone Density status: Completed 04/18/19. Results reflect: Bone density results: OSTEOPENIA. Repeat every 2 years.    Additional Screening:  Hepatitis C Screening:  Completed 12/04/15  Vision Screening: Recommended annual ophthalmology exams for early detection of glaucoma and other disorders of the eye. Is the patient up to date with their annual eye exam?  Yes  Who is the provider or what is the name of the office in which the patient attends annual eye exams? [place]   Dental Screening: Recommended annual dental exams for proper oral hygiene  Community Resource Referral / Chronic Care Management: CRR required this visit?  No   CCM required this visit?  No      Plan:     I have personally reviewed and noted the following in the patient's chart:   . Medical and social history . Use  of alcohol, tobacco  or illicit drugs  . Current medications and supplements . Functional ability and status . Nutritional status . Physical activity . Advanced directives . List of other physicians . Hospitalizations, surgeries, and ER visits in previous 12 months . Vitals . Screenings to include cognitive, depression, and falls . Referrals and appointments  In addition, I have reviewed and discussed with patient certain preventive protocols, quality metrics, and best practice recommendations. A written personalized care plan for preventive services as well as general preventive health recommendations were provided to patient.     Willette Brace, LPN   16/[id number]   Nurse Notes: None

## 2020-02-28 NOTE — Patient Instructions (Addendum)
Kelsey Barron , Thank you for taking time to come for your Medicare Wellness Visit. I appreciate your ongoing commitment to your health goals. Please review the following plan we discussed and let me know if I can assist you in the future.   Screening recommendations/referrals: Colonoscopy: Done 09/14/18 Mammogram: Done 04/18/19 Bone Density: Done 04/18/19 Recommended yearly ophthalmology/optometry visit for glaucoma screening and checkup Recommended yearly dental visit for hygiene and checkup  Vaccinations: Influenza vaccine: Up to date Pneumococcal vaccine: Up to date Tdap vaccine: Up to date Shingles vaccine: Completed   02/11/18 & 04/27/18 Covid-19:Completed 1/21, 2/11 12/05/19  Advanced directives: Please bring a copy of your health care power of attorney and living will to the office at your convenience.  Conditions/risks identified: maintain health and exercise  Next appointment: Follow up in one year for your annual wellness visit    Preventive Care 65 Years and Older, Female Preventive care refers to lifestyle choices and visits with your health care provider that can promote health and wellness. What does preventive care include?  A yearly physical exam. This is also called an annual well check.  Dental exams once or twice a year.  Routine eye exams. Ask your health care provider how often you should have your eyes checked.  Personal lifestyle choices, including:  Daily care of your teeth and gums.  Regular physical activity.  Eating a healthy diet.  Avoiding tobacco and drug use.  Limiting alcohol use.  Practicing safe sex.  Taking low-dose aspirin every day.  Taking vitamin and mineral supplements as recommended by your health care provider. What happens during an annual well check? The services and screenings done by your health care provider during your annual well check will depend on your age, overall health, lifestyle risk factors, and family history of  disease. Counseling  Your health care provider may ask you questions about your:  Alcohol use.  Tobacco use.  Drug use.  Emotional well-being.  Home and relationship well-being.  Sexual activity.  Eating habits.  History of falls.  Memory and ability to understand (cognition).  Work and work Statistician.  Reproductive health. Screening  You may have the following tests or measurements:  Height, weight, and BMI.  Blood pressure.  Lipid and cholesterol levels. These may be checked every 5 years, or more frequently if you are over 41 years old.  Skin check.  Lung cancer screening. You may have this screening every year starting at age 50 if you have a 30-pack-year history of smoking and currently smoke or have quit within the past 15 years.  Fecal occult blood test (FOBT) of the stool. You may have this test every year starting at age 58.  Flexible sigmoidoscopy or colonoscopy. You may have a sigmoidoscopy every 5 years or a colonoscopy every 10 years starting at age 16.  Hepatitis C blood test.  Hepatitis B blood test.  Sexually transmitted disease (STD) testing.  Diabetes screening. This is done by checking your blood sugar (glucose) after you have not eaten for a while (fasting). You may have this done every 1-3 years.  Bone density scan. This is done to screen for osteoporosis. You may have this done starting at age 52.  Mammogram. This may be done every 1-2 years. Talk to your health care provider about how often you should have regular mammograms. Talk with your health care provider about your test results, treatment options, and if necessary, the need for more tests. Vaccines  Your health care provider may  recommend certain vaccines, such as:  Influenza vaccine. This is recommended every year.  Tetanus, diphtheria, and acellular pertussis (Tdap, Td) vaccine. You may need a Td booster every 10 years.  Zoster vaccine. You may need this after age  6.  Pneumococcal 13-valent conjugate (PCV13) vaccine. One dose is recommended after age 52.  Pneumococcal polysaccharide (PPSV23) vaccine. One dose is recommended after age 79. Talk to your health care provider about which screenings and vaccines you need and how often you need them. This information is not intended to replace advice given to you by your health care provider. Make sure you discuss any questions you have with your health care provider. Document Released: 03/23/2015 Document Revised: 11/14/2015 Document Reviewed: 12/26/2014 Elsevier Interactive Patient Education  2017 [place] Prevention in the Home Falls can cause injuries. They can happen to people of all ages. There are many things you can do to make your home safe and to help prevent falls. What can I do on the outside of my home?  Regularly fix the edges of walkways and driveways and fix any cracks.  Remove anything that might make you trip as you walk through a door, such as a raised step or threshold.  Trim any bushes or trees on the path to your home.  Use bright outdoor lighting.  Clear any walking paths of anything that might make someone trip, such as rocks or tools.  Regularly check to see if handrails are loose or broken. Make sure that both sides of any steps have handrails.  Any raised decks and porches should have guardrails on the edges.  Have any leaves, snow, or ice cleared regularly.  Use sand or salt on walking paths during winter.  Clean up any spills in your garage right away. This includes oil or grease spills. What can I do in the bathroom?  Use night lights.  Install grab bars by the toilet and in the tub and shower. Do not use towel bars as grab bars.  Use non-skid mats or decals in the tub or shower.  If you need to sit down in the shower, use a plastic, non-slip stool.  Keep the floor dry. Clean up any water that spills on the floor as soon as it happens.  Remove  soap buildup in the tub or shower regularly.  Attach bath mats securely with double-sided non-slip rug tape.  Do not have throw rugs and other things on the floor that can make you trip. What can I do in the bedroom?  Use night lights.  Make sure that you have a light by your bed that is easy to reach.  Do not use any sheets or blankets that are too big for your bed. They should not hang down onto the floor.  Have a firm chair that has side arms. You can use this for support while you get dressed.  Do not have throw rugs and other things on the floor that can make you trip. What can I do in the kitchen?  Clean up any spills right away.  Avoid walking on wet floors.  Keep items that you use a lot in easy-to-reach places.  If you need to reach something above you, use a strong step stool that has a grab bar.  Keep electrical cords out of the way.  Do not use floor polish or wax that makes floors slippery. If you must use wax, use non-skid floor wax.  Do not have throw rugs and  other things on the floor that can make you trip. What can I do with my stairs?  Do not leave any items on the stairs.  Make sure that there are handrails on both sides of the stairs and use them. Fix handrails that are broken or loose. Make sure that handrails are as long as the stairways.  Check any carpeting to make sure that it is firmly attached to the stairs. Fix any carpet that is loose or worn.  Avoid having throw rugs at the top or bottom of the stairs. If you do have throw rugs, attach them to the floor with carpet tape.  Make sure that you have a light switch at the top of the stairs and the bottom of the stairs. If you do not have them, ask someone to add them for you. What else can I do to help prevent falls?  Wear shoes that:  Do not have high heels.  Have rubber bottoms.  Are comfortable and fit you well.  Are closed at the toe. Do not wear sandals.  If you use a  stepladder:  Make sure that it is fully opened. Do not climb a closed stepladder.  Make sure that both sides of the stepladder are locked into place.  Ask someone to hold it for you, if possible.  Clearly mark and make sure that you can see:  Any grab bars or handrails.  First and last steps.  Where the edge of each step is.  Use tools that help you move around (mobility aids) if they are needed. These include:  Canes.  Walkers.  Scooters.  Crutches.  Turn on the lights when you go into a dark area. Replace any light bulbs as soon as they burn out.  Set up your furniture so you have a clear path. Avoid moving your furniture around.  If any of your floors are uneven, fix them.  If there are any pets around you, be aware of where they are.  Review your medicines with your doctor. Some medicines can make you feel dizzy. This can increase your chance of falling. Ask your doctor what other things that you can do to help prevent falls. This information is not intended to replace advice given to you by your health care provider. Make sure you discuss any questions you have with your health care provider. Document Released: 12/21/2008 Document Revised: 08/02/2015 Document Reviewed: 03/31/2014 Elsevier Interactive Patient Education  2017 [employer].

## 2020-04-18 ENCOUNTER — Ambulatory Visit: Payer: Medicare Other

## 2020-04-23 ENCOUNTER — Ambulatory Visit: Payer: Medicare Other | Admitting: Psychologist

## 2020-06-11 ENCOUNTER — Inpatient Hospital Stay: Admission: RE | Admit: 2020-06-11 | Payer: Medicare Other | Source: Ambulatory Visit

## 2020-06-12 ENCOUNTER — Ambulatory Visit: Payer: Medicare Other

## 2020-07-31 ENCOUNTER — Ambulatory Visit
Admission: RE | Admit: 2020-07-31 | Discharge: 2020-07-31 | Disposition: A | Discharge status: Home / Self Care | Payer: Medicare Other | Source: Ambulatory Visit | Attending: Obstetrics and Gynecology | Admitting: Obstetrics and Gynecology

## 2020-07-31 ENCOUNTER — Other Ambulatory Visit: Payer: Self-pay

## 2020-07-31 DIAGNOSIS — Z1231 Encounter for screening mammogram for malignant neoplasm of breast: Secondary | ICD-10-CM

## 2020-11-21 ENCOUNTER — Encounter: Payer: Self-pay | Admitting: Family Medicine

## 2020-11-21 ENCOUNTER — Other Ambulatory Visit: Payer: Self-pay

## 2020-11-21 ENCOUNTER — Ambulatory Visit (INDEPENDENT_AMBULATORY_CARE_PROVIDER_SITE_OTHER): Payer: Medicare Other | Admitting: Family Medicine

## 2020-11-21 VITALS — BP 82/58 | HR 63 | Temp 97.7°F | Ht 66.75 in | Wt 133.7 lb

## 2020-11-21 DIAGNOSIS — F4321 Adjustment disorder with depressed mood: Secondary | ICD-10-CM

## 2020-11-21 DIAGNOSIS — Z0000 Encounter for general adult medical examination without abnormal findings: Secondary | ICD-10-CM

## 2020-11-21 DIAGNOSIS — Z131 Encounter for screening for diabetes mellitus: Secondary | ICD-10-CM

## 2020-11-21 DIAGNOSIS — Z13220 Encounter for screening for lipoid disorders: Secondary | ICD-10-CM | POA: Diagnosis not present

## 2020-11-21 DIAGNOSIS — Z130 Encounter for screening for diseases of the blood and blood-forming organs and certain disorders involving the immune mechanism: Secondary | ICD-10-CM | POA: Diagnosis not present

## 2020-11-21 LAB — LIPID PANEL
Cholesterol: 175 mg/dL (ref 0–200)
HDL: 59.8 mg/dL (ref >39.00)
LDL Cholesterol: 92 mg/dL (ref 0–99)
NonHDL: 115.37
Total CHOL/HDL Ratio: 3
Triglycerides: 118 mg/dL (ref 0.0–149.0)
VLDL: 23.6 mg/dL (ref 0.0–40.0)

## 2020-11-21 LAB — COMPREHENSIVE METABOLIC PANEL
ALT: 17 U/L (ref 0–35)
AST: 19 U/L (ref 0–37)
Albumin: 4.2 g/dL (ref 3.5–5.2)
Alkaline Phosphatase: 48 U/L (ref 39–117)
BUN: 10 mg/dL (ref 6–23)
CO2: 31 mEq/L (ref 19–32)
Calcium: 9 mg/dL (ref 8.4–10.5)
Chloride: 105 mEq/L (ref 96–112)
Creatinine, Ser: 0.73 mg/dL (ref 0.40–1.20)
GFR: 84.46 mL/min (ref >60.00)
Glucose, Bld: 77 mg/dL (ref 70–99)
Potassium: 4.1 mEq/L (ref 3.5–5.1)
Sodium: 142 mEq/L (ref 135–145)
Total Bilirubin: 0.8 mg/dL (ref 0.2–1.2)
Total Protein: 6.9 g/dL (ref 6.0–8.3)

## 2020-11-21 LAB — CBC WITH DIFFERENTIAL/PLATELET
Basophils Absolute: 0 10*3/uL (ref 0.0–0.1)
Basophils Relative: 0.4 % (ref 0.0–3.0)
Eosinophils Absolute: 0 10*3/uL (ref 0.0–0.7)
Eosinophils Relative: 0.6 % (ref 0.0–5.0)
HCT: 42.8 % (ref 36.0–46.0)
Hemoglobin: 14.2 g/dL (ref 12.0–15.0)
Lymphocytes Relative: 15.8 % (ref 12.0–46.0)
Lymphs Abs: 0.6 10*3/uL — ABNORMAL LOW (ref 0.7–4.0)
MCHC: 33.2 g/dL (ref 30.0–36.0)
MCV: 94.7 fl (ref 78.0–100.0)
Monocytes Absolute: 0.3 10*3/uL (ref 0.1–1.0)
Monocytes Relative: 7.9 % (ref 3.0–12.0)
Neutro Abs: 2.9 10*3/uL (ref 1.4–7.7)
Neutrophils Relative %: 75.3 % (ref 43.0–77.0)
Platelets: 210 10*3/uL (ref 150.0–400.0)
RBC: 4.52 Mil/uL (ref 3.87–5.11)
RDW: 13.3 % (ref 11.5–15.5)
WBC: 3.9 10*3/uL — ABNORMAL LOW (ref 4.0–10.5)

## 2020-11-21 NOTE — Progress Notes (Signed)
Kelsey Barron DOB: [birth year] Encounter date: 11/21/2020  This is a 70 y.o. female who presents for complete physical   History of present illness/Additional concerns:  Husband passed away in [place], unexpected. Worst year of her life. Did grief counseling.  Sleep has been more difficult - having to set schedule. Wakes more in night, but falls asleep ok.    Past Medical History:  Diagnosis Date   Cancer ([place])    Basal Skin Cell ( 2002)   Cataract    Fibroid    Psoriasis    Rib fracture    Shingles times 3   STD (sexually transmitted disease)    HSV1   Past Surgical History:  Procedure Laterality Date   VAGINAL HYSTERECTOMY     ovaries retained, fibroids bleeding   WISDOM TOOTH EXTRACTION     Allergies  Allergen Reactions   Bee Venom    Current Meds  Medication Sig   Calcium Citrate-Vitamin D (CALCIUM + D PO) Take 600 mg by mouth.   EPINEPHrine 0.3 mg/0.3 mL IJ SOAJ injection    Estradiol (VAGIFEM) 10 MCG TABS vaginal tablet INSERT 1 TABLET VAGINALLY 2TIMES A WEEK   Fluocinolone Acetonide Scalp 0.01 % OIL Apply topically.   Multiple Vitamins-Minerals (ICAPS) CAPS Take by mouth daily.   valACYclovir (VALTREX) 500 MG tablet Take 1 tablet (500 mg total) by mouth 2 (two) times daily.   VITAMIN D PO Take 1,000 Units by mouth daily.    Wheat Dextrin (BENEFIBER ON THE GO) POWD Take by mouth.   Social History   Tobacco Use   Smoking status: Never   Smokeless tobacco: Never  Substance Use Topics   Alcohol use: No   Family History  Problem Relation Age of Onset   Cancer Mother        uterine & colon   Diabetes Mother    Macular degeneration Mother    Dementia Mother    Hepatitis Father    AAA (abdominal aortic aneurysm) Father    Deep vein thrombosis Sister    Diabetes Paternal Grandmother    Stroke Maternal Grandmother    Angina Maternal Grandfather    Heart disease Paternal Grandfather        pacemaker   Breast cancer Neg Hx      Review of Systems   Constitutional:  Negative for activity change, appetite change, chills, fatigue, fever and unexpected weight change.  HENT:  Negative for congestion, ear pain, hearing loss, sinus pressure, sinus pain, sore throat and trouble swallowing.   Eyes:  Positive for visual disturbance (ocular migraine). Negative for pain.  Respiratory:  Negative for cough, chest tightness, shortness of breath and wheezing.   Cardiovascular:  Negative for chest pain, palpitations and leg swelling.  Gastrointestinal:  Negative for abdominal pain, blood in stool, constipation, diarrhea, nausea and vomiting.  Genitourinary:  Negative for difficulty urinating and menstrual problem.  Musculoskeletal:  Negative for arthralgias and back pain.  Skin:  Negative for rash.  Neurological:  Negative for dizziness, weakness, numbness and headaches.  Hematological:  Negative for adenopathy. Does not bruise/bleed easily.  Psychiatric/Behavioral:  Negative for sleep disturbance and suicidal ideas. The patient is not nervous/anxious.    CBC:  Lab Results  Component Value Date   WBC 3.5 12/20/2018   WBC 4.2 12/04/2015   HGB 14.0 12/20/2018   HGB 13.7 11/23/2013   HCT 42.1 12/20/2018   MCH 31.7 12/20/2018   MCH 31.3 12/04/2015   MCHC 33.3 12/20/2018   MCHC 33.4  12/04/2015   RDW 12.1 12/20/2018   PLT 238 12/20/2018   MPV 10.3 12/04/2015   CMP: Lab Results  Component Value Date   NA 139 12/13/2019   NA 141 12/20/2018   K 4.7 12/13/2019   CL 103 12/13/2019   CO2 31 12/13/2019   GLUCOSE 91 12/13/2019   BUN 22 12/13/2019   BUN 23 12/20/2018   CREATININE 0.83 12/13/2019   LABGLOB 2.7 12/20/2018   GFRAA 79 12/20/2018   CALCIUM 9.5 12/13/2019   PROT 6.8 12/13/2019   PROT 6.9 12/20/2018   AGRATIO 1.6 12/20/2018   BILITOT 0.7 12/13/2019   BILITOT 0.7 12/20/2018   ALKPHOS 55 12/20/2018   ALT 18 12/13/2019   AST 17 12/13/2019   LIPID: Lab Results  Component Value Date   CHOL 183 12/13/2019   CHOL 184 12/20/2018    TRIG 117 12/13/2019   HDL 50 12/13/2019   HDL 51 12/20/2018   LDLCALC 111 (H) 12/13/2019   LABVLDL 17 12/20/2018    Objective:  BP (!) 82/58 (BP Location: Left Arm, Patient Position: Sitting, Cuff Size: Normal)   Pulse 63   Temp 97.7 F (36.5 C) (Oral)   Ht 5' 6.75" (1.695 m)   Wt 133 lb 11.2 oz (60.6 kg)   LMP 03/11/1995   SpO2 99%   BMI 21.10 kg/m   Weight: 133 lb 11.2 oz (60.6 kg)   BP Readings from Last 3 Encounters:  11/21/20 (!) 82/58  02/28/20 100/62  02/06/20 108/64   Wt Readings from Last 3 Encounters:  11/21/20 133 lb 11.2 oz (60.6 kg)  02/28/20 138 lb 8 oz (62.8 kg)  02/06/20 135 lb 9.6 oz (61.5 kg)    Physical Exam Constitutional:      General: She is not in acute distress.    Appearance: She is well-developed.  HENT:     Head: Normocephalic and atraumatic.     Right Ear: External ear normal.     Left Ear: External ear normal.     Mouth/Throat:     Pharynx: No oropharyngeal exudate.  Eyes:     Conjunctiva/sclera: Conjunctivae normal.     Pupils: Pupils are equal, round, and reactive to light.  Neck:     Thyroid: No thyromegaly.  Cardiovascular:     Rate and Rhythm: Normal rate and regular rhythm.     Heart sounds: Normal heart sounds. No murmur heard.   No friction rub. No gallop.  Pulmonary:     Effort: Pulmonary effort is normal.     Breath sounds: Normal breath sounds.  Abdominal:     General: Bowel sounds are normal. There is no distension.     Palpations: Abdomen is soft. There is no mass.     Tenderness: There is no abdominal tenderness. There is no guarding.     Hernia: No hernia is present.  Musculoskeletal:        General: No tenderness or deformity. Normal range of motion.     Cervical back: Normal range of motion and neck supple.  Lymphadenopathy:     Cervical: No cervical adenopathy.  Skin:    General: Skin is warm and dry.     Findings: No rash.  Neurological:     Mental Status: She is alert and oriented to person, place, and  time.     Deep Tendon Reflexes: Reflexes normal.     Reflex Scores:      Tricep reflexes are 2+ on the right side and 2+ on the left  side.      Bicep reflexes are 2+ on the right side and 2+ on the left side.      Brachioradialis reflexes are 2+ on the right side and 2+ on the left side.      Patellar reflexes are 2+ on the right side and 2+ on the left side. Psychiatric:        Speech: Speech normal.        Behavior: Behavior normal.        Thought Content: Thought content normal.    Assessment/Plan: There are no preventive care reminders to display for this patient. Health Maintenance reviewed.  1. Preventative health care Keep up with regular exercise and healthy lifestyle.   2. Grieving She is working through normal grieving process. Let me know if any hurdles in process.   Return in about 1 year (around 11/21/2021) for physical exam.  Micheline Rough, MD

## 2020-11-21 NOTE — Addendum Note (Signed)
Addended by: Amanda Cockayne on: 11/21/2020 10:02 AM   Modules accepted: Orders

## 2020-12-22 ENCOUNTER — Other Ambulatory Visit: Payer: Self-pay | Admitting: Obstetrics and Gynecology

## 2020-12-22 DIAGNOSIS — N952 Postmenopausal atrophic vaginitis: Secondary | ICD-10-CM

## 2020-12-24 NOTE — Telephone Encounter (Signed)
Annual exam scheduled on 02/11/21 Last mammogram was on 07/2020

## 2021-02-06 NOTE — Progress Notes (Signed)
70 y.o. G58P2002 Widowed White or Caucasian Not Hispanic or Latino female here for annual exam. H/O hysterectomy.  Husband passed away in Mar 31, 2022. He was only not feeling well for 2 weeks, 4 days after diagnosis of liver cancer he died. She has fairly good support. She is doing okay.  Daughter is outside of [place], has a daughter. Son is in [place], has a daughter.    H/O oral HSV, prn use of valtrex. She doesn't need a refill at this time.  H/O vaginal atrophy, uses vaginal estrogen.   Patient's last menstrual period was 03/11/1995.          Sexually active: No.  The current method of family planning is post menopausal status.    Exercising: Yes.     Walking and work out classes  Smoker:  no  Health Maintenance: Pap:  01/26/19 WNL Hr HPV Neg of vaginal cuff, done secondary to pigment change at the apex.  History of abnormal Pap:  no MMG:  08/02/20 Bi-rads 1 neg  BMD:   04/18/19 osteopenic, FRAX 9.8/1.5  Colonoscopy: 09/14/18 follow up 5 years  TDaP:  11/22/12  Gardasil: na     reports that she has never smoked. She has never used smokeless tobacco. She reports that she does not drink alcohol and does not use drugs. Retired. 2 kids, 2 granddaughters. No one is local.   Past Medical History:  Diagnosis Date   Cancer ([place])    Basal Skin Cell ( 2002)   Cataract    Fibroid    Psoriasis    Rib fracture    Shingles times 3   STD (sexually transmitted disease)    HSV1    Past Surgical History:  Procedure Laterality Date   VAGINAL HYSTERECTOMY     ovaries retained, fibroids bleeding   WISDOM TOOTH EXTRACTION      Current Outpatient Medications  Medication Sig Dispense Refill   Calcium Citrate-Vitamin D (CALCIUM + D PO) Take 600 mg by mouth.     EPINEPHrine 0.3 mg/0.3 mL IJ SOAJ injection      Estradiol (VAGIFEM) 10 MCG TABS vaginal tablet INSERT 1 TABLET VAGINALLY 2TIMES A WEEK 24 tablet 3   Fluocinolone Acetonide Scalp 0.01 % OIL Apply topically.     Multiple Vitamins-Minerals (ICAPS)  CAPS Take by mouth daily.     valACYclovir (VALTREX) 500 MG tablet Take 1 tablet (500 mg total) by mouth 2 (two) times daily. 30 tablet 12   VITAMIN D PO Take 1,000 Units by mouth daily.      Wheat Dextrin (BENEFIBER ON THE GO) POWD Take by mouth.     No current facility-administered medications for this visit.    Family History  Problem Relation Age of Onset   Cancer Mother        uterine & colon   Diabetes Mother    Macular degeneration Mother    Dementia Mother    Hepatitis Father    AAA (abdominal aortic aneurysm) Father    Deep vein thrombosis Sister    Diabetes Paternal Grandmother    Stroke Maternal Grandmother    Angina Maternal Grandfather    Heart disease Paternal Grandfather        pacemaker   Breast cancer Neg Hx     Review of Systems  All other systems reviewed and are negative.  Exam:   BP 130/74   Pulse 82   Ht 5' 5.75" (1.67 m)   Wt 137 lb (62.1 kg)   LMP 03/11/1995  SpO2 98%   BMI 22.28 kg/m   Weight change: @WEIGHTCHANGE @ Height:   Height: 5' 5.75" (167 cm)  Ht Readings from Last 3 Encounters:  02/11/21 5' 5.75" (1.67 m)  11/21/20 5' 6.75" (1.695 m)  02/06/20 5' 5.75" (1.67 m)    General appearance: alert, cooperative and appears stated age Head: Normocephalic, without obvious abnormality, atraumatic Neck: no adenopathy, supple, symmetrical, trachea midline and thyroid normal to inspection and palpation Lungs: clear to auscultation bilaterally Cardiovascular: regular rate and rhythm Breasts: normal appearance, no masses or tenderness Abdomen: soft, non-tender; non distended,  no masses,  no organomegaly Extremities: extremities normal, atraumatic, no cyanosis or edema Skin: Skin color, texture, turgor normal. No rashes or lesions Lymph nodes: Cervical, supraclavicular, and axillary nodes normal. No abnormal inguinal nodes palpated Neurologic: Grossly normal   Pelvic: External genitalia:  no lesions              Urethra:  normal appearing  urethra with no masses, tenderness or lesions              Bartholins and Skenes: normal                 Vagina: mildly atrophic appearing vagina with normal color and discharge, no lesions              Cervix: absent               Bimanual Exam:  Uterus:  uterus absent              Adnexa: no mass, fullness, tenderness               Rectovaginal: Confirms               Anus:  normal sphincter tone, no lesions  Gae Dry chaperoned for the exam.    1. Encounter for gynecological examination without abnormal finding Discussed breast self exam Mammogram in 5/23 Colonoscopy UTD  2. Vaginal atrophy She is using the estrogen 2 x a week. She gets a new insurance in January and wants to wait until then to refill it. She will reach out with the mail order pharmacy information.   3. History of osteopenia Discussed calcium and vit D intake - DG Bone Density; Future  4. Hypoestrogenism - DG Bone Density; Future

## 2021-02-11 ENCOUNTER — Ambulatory Visit (INDEPENDENT_AMBULATORY_CARE_PROVIDER_SITE_OTHER): Payer: Medicare Other | Admitting: Obstetrics and Gynecology

## 2021-02-11 ENCOUNTER — Encounter: Payer: Self-pay | Admitting: Obstetrics and Gynecology

## 2021-02-11 ENCOUNTER — Other Ambulatory Visit: Payer: Self-pay

## 2021-02-11 VITALS — BP 130/74 | HR 82 | Ht 65.75 in | Wt 137.0 lb

## 2021-02-11 DIAGNOSIS — E2839 Other primary ovarian failure: Secondary | ICD-10-CM

## 2021-02-11 DIAGNOSIS — N952 Postmenopausal atrophic vaginitis: Secondary | ICD-10-CM | POA: Diagnosis not present

## 2021-02-11 DIAGNOSIS — Z8739 Personal history of other diseases of the musculoskeletal system and connective tissue: Secondary | ICD-10-CM | POA: Diagnosis not present

## 2021-02-11 DIAGNOSIS — Z01419 Encounter for gynecological examination (general) (routine) without abnormal findings: Secondary | ICD-10-CM

## 2021-02-11 NOTE — Patient Instructions (Signed)

## 2021-03-13 ENCOUNTER — Encounter: Payer: Self-pay | Admitting: Obstetrics and Gynecology

## 2021-03-13 DIAGNOSIS — N952 Postmenopausal atrophic vaginitis: Secondary | ICD-10-CM

## 2021-03-13 MED ORDER — ESTRADIOL 10 MCG VA TABS: ORAL_TABLET | VAGINAL | 3 refills | Status: AC

## 2021-03-13 NOTE — Telephone Encounter (Signed)
AEX 02/11/21 ". Vaginal atrophy She is using the estrogen 2 x a week. She gets a new insurance in January and wants to wait until then to refill it. She will reach out with the mail order pharmacy information."

## 2021-03-19 ENCOUNTER — Ambulatory Visit: Payer: Medicare Other

## 2021-03-19 VITALS — Ht 65.0 in | Wt 137.0 lb

## 2021-03-19 DIAGNOSIS — Z0000 Encounter for general adult medical examination without abnormal findings: Secondary | ICD-10-CM

## 2021-03-19 NOTE — Patient Instructions (Addendum)
Ms. Kelsey Barron , Thank you for taking time to come for your Medicare Wellness Visit. I appreciate your ongoing commitment to your health goals. Please review the following plan we discussed and let me know if I can assist you in the future.   These are the goals we discussed:  Goals      DIET - REDUCE FAT INTAKE     Would like to reach 145 pounds     Patient Stated     Maintain weight and keep up with exercise.        This is a list of the screening recommended for you and due dates:  Health Maintenance  Topic Date Due   COVID-19 Vaccine (6 - Booster for Pfizer series) 01/15/2021   Mammogram  08/01/2022   Tetanus Vaccine  11/23/2022   Colon Cancer Screening  09/14/2023   Pneumonia Vaccine  Completed   Flu Shot  Completed   DEXA scan (bone density measurement)  Completed   Hepatitis C Screening: USPSTF Recommendation to screen - Ages 71-79 yo.  Completed   Zoster (Shingles) Vaccine  Completed   HPV Vaccine  Aged Out    Advanced directives: Yes  Conditions/risks identified: None  Next appointment: Follow up in one year for your annual wellness visit  Preventive Care 65 Years and Older, Female Preventive care refers to lifestyle choices and visits with your health care provider that can promote health and wellness. What does preventive care include? A yearly physical exam. This is also called an annual well check. Dental exams once or twice a year. Routine eye exams. Ask your health care provider how often you should have your eyes checked. Personal lifestyle choices, including: Daily care of your teeth and gums. Regular physical activity. Eating a healthy diet. Avoiding tobacco and drug use. Limiting alcohol use. Practicing safe sex. Taking low-dose aspirin every day. Taking vitamin and mineral supplements as recommended by your health care provider. What happens during an annual well check? The services and screenings done by your health care provider during your annual  well check will depend on your age, overall health, lifestyle risk factors, and family history of disease. Counseling  Your health care provider may ask you questions about your: Alcohol use. Tobacco use. Drug use. Emotional well-being. Home and relationship well-being. Sexual activity. Eating habits. History of falls. Memory and ability to understand (cognition). Work and work Statistician. Reproductive health. Screening  You may have the following tests or measurements: Height, weight, and BMI. Blood pressure. Lipid and cholesterol levels. These may be checked every 5 years, or more frequently if you are over 55 years old. Skin check. Lung cancer screening. You may have this screening every year starting at age 76 if you have a 30-pack-year history of smoking and currently smoke or have quit within the past 15 years. Fecal occult blood test (FOBT) of the stool. You may have this test every year starting at age 99. Flexible sigmoidoscopy or colonoscopy. You may have a sigmoidoscopy every 5 years or a colonoscopy every 10 years starting at age 11. Hepatitis C blood test. Hepatitis B blood test. Sexually transmitted disease (STD) testing. Diabetes screening. This is done by checking your blood sugar (glucose) after you have not eaten for a while (fasting). You may have this done every 1-3 years. Bone density scan. This is done to screen for osteoporosis. You may have this done starting at age 62. Mammogram. This may be done every 1-2 years. Talk to your health care provider  about how often you should have regular mammograms. Talk with your health care provider about your test results, treatment options, and if necessary, the need for more tests. Vaccines  Your health care provider may recommend certain vaccines, such as: Influenza vaccine. This is recommended every year. Tetanus, diphtheria, and acellular pertussis (Tdap, Td) vaccine. You may need a Td booster every 10 years. Zoster  vaccine. You may need this after age 28. Pneumococcal 13-valent conjugate (PCV13) vaccine. One dose is recommended after age 1. Pneumococcal polysaccharide (PPSV23) vaccine. One dose is recommended after age 55. Talk to your health care provider about which screenings and vaccines you need and how often you need them. This information is not intended to replace advice given to you by your health care provider. Make sure you discuss any questions you have with your health care provider. Document Released: 03/23/2015 Document Revised: 11/14/2015 Document Reviewed: 12/26/2014 Elsevier Interactive Patient Education  2017 [place] Prevention in the Home Falls can cause injuries. They can happen to people of all ages. There are many things you can do to make your home safe and to help prevent falls. What can I do on the outside of my home? Regularly fix the edges of walkways and driveways and fix any cracks. Remove anything that might make you trip as you walk through a door, such as a raised step or threshold. Trim any bushes or trees on the path to your home. Use bright outdoor lighting. Clear any walking paths of anything that might make someone trip, such as rocks or tools. Regularly check to see if handrails are loose or broken. Make sure that both sides of any steps have handrails. Any raised decks and porches should have guardrails on the edges. Have any leaves, snow, or ice cleared regularly. Use sand or salt on walking paths during winter. Clean up any spills in your garage right away. This includes oil or grease spills. What can I do in the bathroom? Use night lights. Install grab bars by the toilet and in the tub and shower. Do not use towel bars as grab bars. Use non-skid mats or decals in the tub or shower. If you need to sit down in the shower, use a plastic, non-slip stool. Keep the floor dry. Clean up any water that spills on the floor as soon as it happens. Remove  soap buildup in the tub or shower regularly. Attach bath mats securely with double-sided non-slip rug tape. Do not have throw rugs and other things on the floor that can make you trip. What can I do in the bedroom? Use night lights. Make sure that you have a light by your bed that is easy to reach. Do not use any sheets or blankets that are too big for your bed. They should not hang down onto the floor. Have a firm chair that has side arms. You can use this for support while you get dressed. Do not have throw rugs and other things on the floor that can make you trip. What can I do in the kitchen? Clean up any spills right away. Avoid walking on wet floors. Keep items that you use a lot in easy-to-reach places. If you need to reach something above you, use a strong step stool that has a grab bar. Keep electrical cords out of the way. Do not use floor polish or wax that makes floors slippery. If you must use wax, use non-skid floor wax. Do not have throw rugs and  other things on the floor that can make you trip. What can I do with my stairs? Do not leave any items on the stairs. Make sure that there are handrails on both sides of the stairs and use them. Fix handrails that are broken or loose. Make sure that handrails are as long as the stairways. Check any carpeting to make sure that it is firmly attached to the stairs. Fix any carpet that is loose or worn. Avoid having throw rugs at the top or bottom of the stairs. If you do have throw rugs, attach them to the floor with carpet tape. Make sure that you have a light switch at the top of the stairs and the bottom of the stairs. If you do not have them, ask someone to add them for you. What else can I do to help prevent falls? Wear shoes that: Do not have high heels. Have rubber bottoms. Are comfortable and fit you well. Are closed at the toe. Do not wear sandals. If you use a stepladder: Make sure that it is fully opened. Do not climb a  closed stepladder. Make sure that both sides of the stepladder are locked into place. Ask someone to hold it for you, if possible. Clearly mark and make sure that you can see: Any grab bars or handrails. First and last steps. Where the edge of each step is. Use tools that help you move around (mobility aids) if they are needed. These include: Canes. Walkers. Scooters. Crutches. Turn on the lights when you go into a dark area. Replace any light bulbs as soon as they burn out. Set up your furniture so you have a clear path. Avoid moving your furniture around. If any of your floors are uneven, fix them. If there are any pets around you, be aware of where they are. Review your medicines with your doctor. Some medicines can make you feel dizzy. This can increase your chance of falling. Ask your doctor what other things that you can do to help prevent falls. This information is not intended to replace advice given to you by your health care provider. Make sure you discuss any questions you have with your health care provider. Document Released: 12/21/2008 Document Revised: 08/02/2015 Document Reviewed: 03/31/2014 Elsevier Interactive Patient Education  2017 [employer].

## 2021-03-19 NOTE — Progress Notes (Signed)
Subjective:   Kelsey Barron is a 71 y.o. female who presents for Medicare Annual (Subsequent) preventive examination.  Review of Systems    No ROS Cardiac Risk Factors include: advanced age (>68men, >102 women)     Objective:    Today's Vitals   03/19/21 0826  Weight: 137 lb (62.1 kg)  Height: 5\' 5"  (1.651 m)   Body mass index is 22.8 kg/m.  Advanced Directives 03/19/2021 02/28/2020 04/26/2018  Does Patient Have a Medical Advance Directive? Yes Yes Yes  Type of Paramedic of [place];Living will [place];Living will Out of facility DNR (pink MOST or yellow form);[place];Living will  Does patient want to make changes to medical advance directive? No - Patient declined - No - Patient declined  Copy of [place] in Chart? No - copy requested No - copy requested No - copy requested    Current Medications (verified) Outpatient Encounter Medications as of 03/19/2021  Medication Sig   Calcium Citrate-Vitamin D (CALCIUM + D PO) Take 600 mg by mouth.   EPINEPHrine 0.3 mg/0.3 mL IJ SOAJ injection    Estradiol (VAGIFEM) 10 MCG TABS vaginal tablet INSERT 1 TABLET VAGINALLY 2TIMES A WEEK   Fluocinolone Acetonide Scalp 0.01 % OIL Apply topically.   Multiple Vitamins-Minerals (ICAPS) CAPS Take by mouth daily.   valACYclovir (VALTREX) 500 MG tablet Take 1 tablet (500 mg total) by mouth 2 (two) times daily.   VITAMIN D PO Take 1,000 Units by mouth daily.    Wheat Dextrin (BENEFIBER ON THE GO) POWD Take by mouth.   No facility-administered encounter medications on file as of 03/19/2021.    Allergies (verified) Bee venom   History: Past Medical History:  Diagnosis Date   Cancer ([place])    Basal Skin Cell ( 2002)   Cataract    Fibroid    Psoriasis    Rib fracture    Shingles times 3   STD (sexually transmitted disease)    HSV1   Past Surgical History:  Procedure Laterality Date   VAGINAL  HYSTERECTOMY     ovaries retained, fibroids bleeding   WISDOM TOOTH EXTRACTION     Family History  Problem Relation Age of Onset   Cancer Mother        uterine & colon   Diabetes Mother    Macular degeneration Mother    Dementia Mother    Hepatitis Father    AAA (abdominal aortic aneurysm) Father    Deep vein thrombosis Sister    Diabetes Paternal Grandmother    Stroke Maternal Grandmother    Angina Maternal Grandfather    Heart disease Paternal Grandfather        pacemaker   Breast cancer Neg Hx    Social History   Socioeconomic History   Marital status: Widowed    Spouse name: Not on file   Number of children: Not on file   Years of education: Not on file   Highest education level: Not on file  Occupational History   Occupation: retired  Tobacco Use   Smoking status: Never   Smokeless tobacco: Never  Substance and Sexual Activity   Alcohol use: No   Drug use: No   Sexual activity: Yes    Partners: Male    Birth control/protection: Surgical    Comment: [facility]  Other Topics Concern   Not on file  Social History Narrative   Not on file   Social Determinants of Health  Financial Resource Strain: Low Risk    Difficulty of Paying Living Expenses: Not hard at all  Food Insecurity: No Food Insecurity   Worried About Charity fundraiser in the Last Year: Never true   Ran Out of Food in the Last Year: Never true  Transportation Needs: No Transportation Needs   Lack of Transportation (Medical): No   Lack of Transportation (Non-Medical): No  Physical Activity: Sufficiently Active   Days of Exercise per Week: 5 days   Minutes of Exercise per Session: 30 min  Stress: No Stress Concern Present   Feeling of Stress : Only a little  Social Connections: Moderately Integrated   Frequency of Communication with Friends and Family: More than three times a week   Frequency of Social Gatherings with Friends and Family: More than three times a week   Attends Religious Services:  More than 4 times per year   Active Member of [employer] or Organizations: Yes   Attends Archivist Meetings: More than 4 times per year   Marital Status: Widowed   Clinical Intake:   Diabetic? No  Interpreter Needed?: No Activities of Daily Living In your present state of health, do you have any difficulty performing the following activities: 03/19/2021  Hearing? N  Vision? N  Difficulty concentrating or making decisions? N  Walking or climbing stairs? N  Dressing or bathing? N  Doing errands, shopping? N  Preparing Food and eating ? N  Using the Toilet? N  In the past six months, have you accidently leaked urine? N  Do you have problems with loss of bowel control? N  Managing your Medications? N  Managing your Finances? N  Housekeeping or managing your Housekeeping? N  Some recent data might be hidden    Patient Care Team: Caren Macadam, MD as PCP - General (Family Medicine) Regina Eck, CNM as Referring Physician (Certified Nurse Midwife) Devra Dopp, MD as Referring Physician (Dermatology) Laurence Spates, MD (Inactive) as Consulting Physician (Gastroenterology)  Indicate any recent Medical Services you may have received from other than [facility] providers in the past year (date may be approximate).     Assessment:   This is a routine wellness examination for Kelsey Barron.  Virtual Visit via Telephone Note  I connected with  Kelsey Barron on 03/19/21 at  8:15 AM EST by telephone and verified that I am speaking with the correct person using two identifiers.  Location: Patient: Home Provider: Office Persons participating in the virtual visit: patient/Nurse Health Advisor   I discussed the limitations, risks, security and privacy concerns of performing an evaluation and management service by telephone and the availability of in person appointments. The patient expressed understanding and agreed to proceed.  Interactive audio and video  telecommunications were attempted between this nurse and patient, however failed, due to patient having technical difficulties OR patient did not have access to video capability.  We continued and completed visit with audio only.  Some vital signs may be absent or patient reported.   Criselda Peaches, LPN   Hearing/Vision screen Hearing Screening - Comments:: No difficulty hearing Vision Screening - Comments:: Wears glasses. Followed by [facility] issues and exercise activities discussed: Current Exercise Habits: Home exercise routine, Type of exercise: walking, Time (Minutes): 30, Frequency (Times/Week): 5, Weekly Exercise (Minutes/Week): 150, Intensity: Moderate   Goals Addressed             This Visit's Progress    Patient Stated  Maintain weight and keep up with exercise.       Depression Screen PHQ 2/9 Scores 03/19/2021 11/21/2020 02/28/2020 12/28/2018 04/26/2018 01/13/2018  PHQ - 2 Score 2 2 0 0 0 0  PHQ- 9 Score 2 3 - - - -    Fall Risk Fall Risk  03/19/2021 03/18/2021 11/21/2020 02/28/2020 12/28/2018  Falls in the past year? 0 0 0 0 0  Number falls in past yr: 0 - 0 0 0  Injury with Fall? 0 - - 0 0  Risk for fall due to : - - - Impaired vision -  Follow up - - - Falls prevention discussed -    FALL RISK PREVENTION PERTAINING TO THE HOME:  Any stairs in or around the home? Yes  If so, are there any without handrails? No  Home free of loose throw rugs in walkways, pet beds, electrical cords, etc? Yes  Adequate lighting in your home to reduce risk of falls? Yes   ASSISTIVE DEVICES UTILIZED TO PREVENT FALLS:  Life alert? No  Use of a cane, walker or w/c? No  Grab bars in the bathroom? Yes  Shower chair or bench in shower? Yes  Elevated toilet seat or a handicapped toilet? No   TIMED UP AND GO:  Was the test performed? No . Audio Visit  Cognitive Function:     6CIT Screen 03/19/2021 04/26/2018  What Year? 0 points 0 points  What month? 0  points 0 points  What time? 0 points 0 points  Count back from 20 0 points 0 points  Months in reverse 0 points 0 points  Repeat phrase 0 points 0 points  Total Score 0 0    Immunizations Immunization History  Administered Date(s) Administered   Influenza, High Dose Seasonal PF 11/03/2018   Influenza-Unspecified 12/01/2016, 11/26/2017, 11/03/2018, 11/08/2019, 11/08/2020   MODERNA COVID-19 SARS-COV-2 PEDS BIVALENT BOOSTER 6Y-11Y 11/20/2020   Moderna Sars-Covid-2 Vaccination 11/20/2020   PFIZER(Purple Top)SARS-COV-2 Vaccination 03/31/2019, 04/21/2019, 12/05/2019, 06/08/2020   Pneumococcal Conjugate-13 01/13/2018   Pneumococcal Polysaccharide-23 01/21/2019   Tdap 11/22/2012   Zoster Recombinat (Shingrix) 02/11/2018, 04/27/2018   Zoster, Live 11/08/2013   Screening Tests Health Maintenance  Topic Date Due   COVID-19 Vaccine (6 - Booster for [place] series) 01/15/2021   MAMMOGRAM  08/01/2022   TETANUS/TDAP  11/23/2022   COLONOSCOPY (Pts 45-23yrs Insurance coverage will need to be confirmed)  09/14/2023   Pneumonia Vaccine 35+ Years old  Completed   INFLUENZA VACCINE  Completed   DEXA SCAN  Completed   Hepatitis C Screening  Completed   Zoster Vaccines- Shingrix  Completed   HPV VACCINES  Aged Out    Health Maintenance  Health Maintenance Due  Topic Date Due   COVID-19 Vaccine (6 - Booster for [place] series) 01/15/2021     Additional Screening:   Vision Screening: Recommended annual ophthalmology exams for early detection of glaucoma and other disorders of the eye. Is the patient up to date with their annual eye exam?  Yes  Who is the provider or what is the name of the office in which the patient attends annual eye exams? Followed by [facility].  Dental Screening: Recommended annual dental exams for proper oral hygiene  Community Resource Referral / Chronic Care Management: CRR required this visit?  No   CCM required this visit?  No      Plan:     I have  personally reviewed and noted the following in the patients chart:   Medical  and social history Use of alcohol, tobacco or illicit drugs  Current medications and supplements including opioid prescriptions. Patient currently not taking opioids Functional ability and status Nutritional status Physical activity Advanced directives List of other physicians Hospitalizations, surgeries, and ER visits in previous 12 months Vitals Screenings to include cognitive, depression, and falls Referrals and appointments  In addition, I have reviewed and discussed with patient certain preventive protocols, quality metrics, and best practice recommendations. A written personalized care plan for preventive services as well as general preventive health recommendations were provided to patient.     Criselda Peaches, LPN   7/47/1855

## 2021-03-26 ENCOUNTER — Ambulatory Visit: Payer: Medicare Other

## 2021-04-05 ENCOUNTER — Encounter: Payer: Self-pay | Admitting: Obstetrics and Gynecology

## 2021-07-02 ENCOUNTER — Other Ambulatory Visit: Payer: Medicare Other
